# Patient Record
Sex: Female | Born: 1995 | Hispanic: Yes | Marital: Single | Smoking: Never smoker
Health system: Southern US, Community
[De-identification: ages and names within clinical notes are randomized; demographics above are authoritative.]

## PROBLEM LIST (undated history)

## (undated) DIAGNOSIS — I10 Essential (primary) hypertension: Secondary | ICD-10-CM

## (undated) DIAGNOSIS — D649 Anemia, unspecified: Secondary | ICD-10-CM

## (undated) DIAGNOSIS — Z789 Other specified health status: Secondary | ICD-10-CM

## (undated) HISTORY — PX: NO PAST SURGERIES: SHX2092

## (undated) HISTORY — DX: Anemia, unspecified: D64.9

## (undated) HISTORY — DX: Essential (primary) hypertension: I10

---
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
## 2014-05-27 ENCOUNTER — Encounter: Payer: Self-pay | Admitting: Advanced Practice Midwife

## 2015-08-18 ENCOUNTER — Encounter (HOSPITAL_COMMUNITY): Payer: Self-pay | Admitting: Emergency Medicine

## 2015-08-18 DIAGNOSIS — R509 Fever, unspecified: Secondary | ICD-10-CM | POA: Diagnosis not present

## 2015-08-18 DIAGNOSIS — Z79899 Other long term (current) drug therapy: Secondary | ICD-10-CM | POA: Insufficient documentation

## 2015-08-18 DIAGNOSIS — R42 Dizziness and giddiness: Secondary | ICD-10-CM | POA: Insufficient documentation

## 2015-08-18 DIAGNOSIS — D649 Anemia, unspecified: Secondary | ICD-10-CM | POA: Diagnosis not present

## 2015-08-18 DIAGNOSIS — R093 Abnormal sputum: Secondary | ICD-10-CM | POA: Insufficient documentation

## 2015-08-18 DIAGNOSIS — R110 Nausea: Secondary | ICD-10-CM | POA: Diagnosis not present

## 2015-08-18 DIAGNOSIS — Z3202 Encounter for pregnancy test, result negative: Secondary | ICD-10-CM | POA: Insufficient documentation

## 2015-08-18 DIAGNOSIS — Z793 Long term (current) use of hormonal contraceptives: Secondary | ICD-10-CM | POA: Insufficient documentation

## 2015-08-18 LAB — COMPREHENSIVE METABOLIC PANEL
ALBUMIN: 3.8 g/dL (ref 3.5–5.0)
ALK PHOS: 40 U/L (ref 38–126)
ALT: 10 U/L — ABNORMAL LOW (ref 14–54)
AST: 22 U/L (ref 15–41)
Anion gap: 10 (ref 5–15)
BILIRUBIN TOTAL: 0.5 mg/dL (ref 0.3–1.2)
BUN: 9 mg/dL (ref 6–20)
CO2: 29 mmol/L (ref 22–32)
Calcium: 9.2 mg/dL (ref 8.9–10.3)
Chloride: 100 mmol/L — ABNORMAL LOW (ref 101–111)
Creatinine, Ser: 0.59 mg/dL (ref 0.44–1.00)
GFR calc Af Amer: >60 mL/min (ref >60)
GFR calc non Af Amer: >60 mL/min (ref >60)
GLUCOSE: 90 mg/dL (ref 65–99)
POTASSIUM: 3.3 mmol/L — AB (ref 3.5–5.1)
Sodium: 139 mmol/L (ref 135–145)
TOTAL PROTEIN: 7.1 g/dL (ref 6.5–8.1)

## 2015-08-18 LAB — CBC
HEMATOCRIT: 36.4 % (ref 36.0–46.0)
Hemoglobin: 12.7 g/dL (ref 12.0–15.0)
MCH: 29.7 pg (ref 26.0–34.0)
MCHC: 34.9 g/dL (ref 30.0–36.0)
MCV: 85.2 fL (ref 78.0–100.0)
Platelets: 134 10*3/uL — ABNORMAL LOW (ref 150–400)
RBC: 4.27 MIL/uL (ref 3.87–5.11)
RDW: 12.8 % (ref 11.5–15.5)
WBC: 5.2 10*3/uL (ref 4.0–10.5)

## 2015-08-18 LAB — URINALYSIS, ROUTINE W REFLEX MICROSCOPIC
BILIRUBIN URINE: NEGATIVE
GLUCOSE, UA: NEGATIVE mg/dL
HGB URINE DIPSTICK: NEGATIVE
KETONES UR: NEGATIVE mg/dL
Leukocytes, UA: NEGATIVE
NITRITE: NEGATIVE
PH: 6.5 (ref 5.0–8.0)
Protein, ur: NEGATIVE mg/dL
Specific Gravity, Urine: 1.025 (ref 1.005–1.030)

## 2015-08-18 LAB — I-STAT BETA HCG BLOOD, ED (MC, WL, AP ONLY): I-stat hCG, quantitative: <5 m[IU]/mL (ref <5)

## 2015-08-18 LAB — LIPASE, BLOOD: Lipase: 30 U/L (ref 11–51)

## 2015-08-18 NOTE — ED Notes (Signed)
Pt. presents with multiple complaints : fever with emesis , dizziness , nausea , headache and fatigue onset 2 days ago .

## 2015-08-19 ENCOUNTER — Emergency Department (HOSPITAL_COMMUNITY)
Admission: EM | Admit: 2015-08-19 | Discharge: 2015-08-19 | Disposition: A | Discharge status: Home / Self Care | Payer: Medicaid Other | Attending: Emergency Medicine | Admitting: Emergency Medicine

## 2015-08-19 DIAGNOSIS — R110 Nausea: Secondary | ICD-10-CM

## 2015-08-19 MED ORDER — ONDANSETRON 4 MG PO TBDP: 4.0000 mg | ORAL_TABLET | Freq: Three times a day (TID) | ORAL | Status: DC | PRN

## 2015-08-19 MED ORDER — ONDANSETRON 4 MG PO TBDP
4.0000 mg | ORAL_TABLET | Freq: Once | ORAL | Status: AC
  Administered 2015-08-19: 4 mg via ORAL
  Filled 2015-08-19: qty 1

## 2015-08-19 MED ORDER — POTASSIUM CHLORIDE CRYS ER 20 MEQ PO TBCR
40.0000 meq | EXTENDED_RELEASE_TABLET | Freq: Once | ORAL | Status: AC
  Administered 2015-08-19: 40 meq via ORAL
  Filled 2015-08-19: qty 2

## 2015-08-19 NOTE — Discharge Instructions (Signed)
Nausea, Adult °Nausea is the feeling that you have an upset stomach or have to vomit. Nausea by itself is not likely a serious concern, but it may be an early sign of more serious medical problems. As nausea gets worse, it can lead to vomiting. If vomiting develops, there is the risk of dehydration.  °CAUSES  °· Viral infections. °· Food poisoning. °· Medicines. °· Pregnancy. °· Motion sickness. °· Migraine headaches. °· Emotional distress. °· Severe pain from any source. °· Alcohol intoxication. °HOME CARE INSTRUCTIONS °· Get plenty of rest. °· Ask your caregiver about specific rehydration instructions. °· Eat small amounts of food and sip liquids more often. °· Take all medicines as told by your caregiver. °SEEK MEDICAL CARE IF: °· You have not improved after 2 days, or you get worse. °· You have a headache. °SEEK IMMEDIATE MEDICAL CARE IF:  °· You have a fever. °· You faint. °· You keep vomiting or have blood in your vomit. °· You are extremely weak or dehydrated. °· You have dark or bloody stools. °· You have severe chest or abdominal pain. °MAKE SURE YOU: °· Understand these instructions. °· Will watch your condition. °· Will get help right away if you are not doing well or get worse. °  °This information is not intended to replace advice given to you by your health care provider. Make sure you discuss any questions you have with your health care provider. °  °Document Released: 08/19/2004 Document Revised: 08/02/2014 Document Reviewed: 03/24/2011 °Elsevier Interactive Patient Education ©2016 [employer] ° °

## 2015-08-19 NOTE — ED Provider Notes (Signed)
By signing my name below, I, Bethel Born, attest that this documentation has been prepared under the direction and in the presence of  N , DO. Electronically Signed: Bethel Born, ED Scribe. 08/19/2015. 2:08 AM.  TIME SEEN: 2:08 AM  CHIEF COMPLAINT: Nausea  HPI: Brooke Patel is a 20 y.o. female who presents to the Emergency Department complaining of nausea with onset yesterday. Pt reports more than 10 episodes of "spitting".  Associated symptoms include lightheadedness. She had a fever 2 days ago but has none at present. Denies abdominal pain. Also complains of white discharge with urination. She does not see discharge in her underwear.  Pt denies diarrhea, cough, dysuria, hematuria, and vaginal discharge. Her significant other has been ill but not with similar symptoms. LNMP was 08/06/15. No history of abdominal surgery. No recent travel. She denies any actual vomiting.  ROS: See HPI Constitutional: no fever  Eyes: no drainage  ENT: no runny nose   Cardiovascular:  no chest pain  Resp: no SOB  GI: no vomiting GU: no dysuria Integumentary: no rash  Allergy: no hives  Musculoskeletal: no leg swelling  Neurological: no slurred speech ROS otherwise negative  PAST MEDICAL HISTORY/PAST SURGICAL HISTORY:  Past Medical History  Diagnosis Date  . No pertinent past medical history   . Anemia   . Anemia     MEDICATIONS:  Prior to Admission medications   Medication Sig Start Date End Date Taking? Authorizing Provider  ferrous sulfate (FERROUSUL) 325 (65 FE) MG tablet Take 1 tablet (325 mg total) by mouth daily with breakfast. 04/03/11 04/02/12  Lucina Mellow, DO  norgestimate-ethinyl estradiol (SPRINTEC 28) 0.25-35 MG-MCG tablet One tablet daily for birth control. Do not start before 4 week postpartum. 04/03/11   Lucina Mellow, DO    ALLERGIES:  No Known Allergies  SOCIAL HISTORY:  Social History  Substance Use Topics  . Smoking status: Never Smoker   .  Smokeless tobacco: Never Used  . Alcohol Use: No    FAMILY HISTORY: Family History  Problem Relation Age of Onset  . Diabetes Paternal Grandmother   . Cancer Paternal Grandmother     EXAM: BP 125/63 mmHg  Pulse 72  Temp(Src) 98 F (36.7 C) (Oral)  Resp 14  Ht  (1.6 m)  Wt 115 lb (52.164 kg)  BMI 20.38 kg/m2  SpO2 100%  LMP 08/06/2015 CONSTITUTIONAL: Alert and oriented and responds appropriately to questions. Well-appearing; well-nourished, afebrile, nontoxic HEAD: Normocephalic EYES: Conjunctivae clear, PERRL ENT: normal nose; no rhinorrhea; moist mucous membranes; pharynx without lesions noted NECK: Supple, no meningismus, no LAD  CARD: RRR; S1 and S2 appreciated; no murmurs, no clicks, no rubs, no gallops RESP: Normal chest excursion without splinting or tachypnea; breath sounds clear and equal bilaterally; no wheezes, no rhonchi, no rales, no hypoxia or respiratory distress, speaking full sentences ABD/GI: Normal bowel sounds; non-distended; soft, non-tender, no rebound, no guarding, no peritoneal signs BACK:  The back appears normal and is non-tender to palpation, there is no CVA tenderness EXT: Normal ROM in all joints; non-tender to palpation; no edema; normal capillary refill; no cyanosis, no calf tenderness or swelling    SKIN: Normal color for age and race; warm NEURO: Moves all extremities equally, sensation to light touch intact diffusely, cranial nerves II through XII intact PSYCH: The patient's mood and manner are appropriate. Grooming and personal hygiene are appropriate.  MEDICAL DECISION MAKING: Patient here with nausea. Had fever 2 days ago. States that she never acts and vomited but  would "spit". No diarrhea. Abdominal exam is completely benign. Hemodynamically stable. Afebrile and nontoxic. Labs unremarkable other than potassium level of 3.3 which has been replaced. She is not pregnant. Does report white discharge with urination but no dysuria or hematuria.  She is not sure if this discharges coming from urinating or her vagina. Have offered her pelvic exam but she states she will follow-up with her OB/GYN. No lower abdominal pain on exam. She is sectioned active with her husband. No history of STDs. Patient reports feeling better after Zofran. We'll discharge with prescription for Zofran and a coupon for the same. Doubt appendicitis, diverticulitis, colitis, bowel obstruction or other life-threatening illness given her benign exam. Discussed return precautions. Patient verbalizes understanding and is comfortable with this plan. At this time I do not feel she needs emergent abdominal imaging.   I personally performed the services described in this documentation, which was scribed in my presence. The recorded information has been reviewed and is accurate.     Layla Maw , DO 08/19/15 0300

## 2016-10-24 ENCOUNTER — Emergency Department (HOSPITAL_COMMUNITY)
Admission: EM | Admit: 2016-10-24 | Discharge: 2016-10-24 | Disposition: A | Discharge status: Home / Self Care | Payer: Medicaid Other | Attending: Emergency Medicine | Admitting: Emergency Medicine

## 2016-10-24 ENCOUNTER — Encounter (HOSPITAL_COMMUNITY): Payer: Self-pay | Admitting: Emergency Medicine

## 2016-10-24 DIAGNOSIS — R112 Nausea with vomiting, unspecified: Secondary | ICD-10-CM | POA: Diagnosis not present

## 2016-10-24 DIAGNOSIS — R110 Nausea: Secondary | ICD-10-CM

## 2016-10-24 DIAGNOSIS — R197 Diarrhea, unspecified: Secondary | ICD-10-CM

## 2016-10-24 LAB — POC URINE PREG, ED: PREG TEST UR: NEGATIVE

## 2016-10-24 LAB — CBC
HCT: 41.2 % (ref 36.0–46.0)
HEMOGLOBIN: 14.1 g/dL (ref 12.0–15.0)
MCH: 28.5 pg (ref 26.0–34.0)
MCHC: 34.2 g/dL (ref 30.0–36.0)
MCV: 83.4 fL (ref 78.0–100.0)
Platelets: 196 10*3/uL (ref 150–400)
RBC: 4.94 MIL/uL (ref 3.87–5.11)
RDW: 12.9 % (ref 11.5–15.5)
WBC: 7.9 10*3/uL (ref 4.0–10.5)

## 2016-10-24 LAB — LIPASE, BLOOD: Lipase: 14 U/L (ref 11–51)

## 2016-10-24 LAB — URINALYSIS, ROUTINE W REFLEX MICROSCOPIC
Bilirubin Urine: NEGATIVE
Glucose, UA: NEGATIVE mg/dL
Ketones, ur: 20 mg/dL — AB
Leukocytes, UA: NEGATIVE
Nitrite: NEGATIVE
Protein, ur: NEGATIVE mg/dL
SPECIFIC GRAVITY, URINE: 1.005 (ref 1.005–1.030)
pH: 6 (ref 5.0–8.0)

## 2016-10-24 LAB — COMPREHENSIVE METABOLIC PANEL
ALBUMIN: 4.8 g/dL (ref 3.5–5.0)
ALK PHOS: 48 U/L (ref 38–126)
ALT: 9 U/L — AB (ref 14–54)
ANION GAP: 12 (ref 5–15)
AST: 18 U/L (ref 15–41)
BUN: 10 mg/dL (ref 6–20)
CALCIUM: 9.7 mg/dL (ref 8.9–10.3)
CO2: 22 mmol/L (ref 22–32)
CREATININE: 0.75 mg/dL (ref 0.44–1.00)
Chloride: 104 mmol/L (ref 101–111)
GFR calc Af Amer: >60 mL/min (ref >60)
GFR calc non Af Amer: >60 mL/min (ref >60)
GLUCOSE: 89 mg/dL (ref 65–99)
Potassium: 3.5 mmol/L (ref 3.5–5.1)
SODIUM: 138 mmol/L (ref 135–145)
Total Bilirubin: 0.8 mg/dL (ref 0.3–1.2)
Total Protein: 8.2 g/dL — ABNORMAL HIGH (ref 6.5–8.1)

## 2016-10-24 MED ORDER — ONDANSETRON 4 MG PO TBDP
4.0000 mg | ORAL_TABLET | Freq: Once | ORAL | Status: AC
  Administered 2016-10-24: 4 mg via ORAL
  Filled 2016-10-24: qty 1

## 2016-10-24 MED ORDER — ONDANSETRON HCL 4 MG PO TABS: 4.0000 mg | ORAL_TABLET | Freq: Four times a day (QID) | ORAL | 0 refills | Status: DC | PRN

## 2016-10-24 NOTE — ED Notes (Signed)
Pt given sprite to drink. Will reassess.

## 2016-10-24 NOTE — ED Triage Notes (Signed)
n/v/d x 1 week lmp  March 6

## 2016-10-24 NOTE — Discharge Instructions (Signed)
Your blood work is reassuring today.  Please drink plenty of fluids. You may be feeling this way given that you are about to start your period.  Please return without fail for worsening symptoms, including fever, passing out, severe abdominal pain, or any other symptoms concerning to you.

## 2016-10-24 NOTE — ED Notes (Signed)
ED Provider at bedside. 

## 2016-10-24 NOTE — ED Provider Notes (Signed)
[facility]-EMERGENCY DEPT Provider Note   CSN: [id number] Arrival date & time: 10/24/16  1500  By signing my name below, I, Brooke Patel, attest that this documentation has been prepared under the direction and in the presence of physician practitioner, Lavera Guise, MD. Electronically Signed: [name], Scribe. 10/24/2016. 4:34 PM.  History   Chief Complaint Chief Complaint  Patient presents with  . Emesis    The history is provided by the patient. No language interpreter was used.     HPI Comments: Brooke Patel is a 21 y.o. female who presents to the Emergency Department complaining of intermittent nausea for about one week. She reports an associated reduced appetite and lightheadedness due to decreased PO intake. Pt states she started having diarrhea yesterday and has had 3 episodes. She states she has been consistently nauseous with or without PO intake. Pt reports she started having some suprapubic cramping today and notes her menstrual period is scheduled to begin in the next few days. Has had little vaginal spotting. She has been trying to hydrate adequately. No alleviating factors noted. Pt denies vomiting, hematochezia, fevers, cough, congestion, rhinorrhea, dysuria, urinary frequency, abnormal vaginal bleeding/discharge, syncope, or any other associated symptoms.  Past Medical History:  Diagnosis Date  . Anemia   . Anemia   . No pertinent past medical history     Patient Active Problem List   Diagnosis Date Noted  . Teen pregnancy 03/03/2011  . Supervision of normal pregnancy 02/17/2011    Past Surgical History:  Procedure Laterality Date  . NO PAST SURGERIES      OB History    Gravida Para Term Preterm AB Living   SAB TAB Ectopic Multiple Live Births           1       Home Medications    Prior to Admission medications   Medication Sig Start Date End Date Taking? Authorizing Provider  ferrous sulfate (FERROUSUL) 325 (65 FE) MG tablet Take 1  tablet (325 mg total) by mouth daily with breakfast. 04/03/11 04/02/12  Lucina Mellow, DO  norgestimate-ethinyl estradiol (SPRINTEC 28) 0.25-35 MG-MCG tablet One tablet daily for birth control. Do not start before 4 week postpartum. 04/03/11   Suzanna Holbrook, DO  ondansetron (ZOFRAN ODT) 4 MG disintegrating tablet Take 1 tablet (4 mg total) by mouth every 8 (eight) hours as needed for nausea or vomiting. 08/19/15   Kristen N Ward, DO  ondansetron (ZOFRAN) 4 MG tablet Take 1 tablet (4 mg total) by mouth every 6 (six) hours as needed for nausea or vomiting. 10/24/16   Lavera Guise, MD    Family History Family History  Problem Relation Age of Onset  . Diabetes Paternal Grandmother   . Cancer Paternal Grandmother     Social History Social History  Substance Use Topics  . Smoking status: Never Smoker  . Smokeless tobacco: Never Used  . Alcohol use No     Allergies   Patient has no known allergies.   Review of Systems Review of Systems  10/14 systems reviewed and are negative other than those stated in the HPI  Physical Exam Updated Vital Signs BP 139/84 (BP Location: Left Arm)   Pulse 97   Temp 98.1 F (36.7 C) (Oral)   Resp 10   SpO2 100%   Physical Exam Physical Exam  Nursing note and vitals reviewed. Constitutional: Well developed, well nourished, non-toxic, and in no acute distress Head:  Normocephalic and atraumatic.  Mouth/Throat: Oropharynx is clear and moist.  Neck: Normal range of motion. Neck supple.  Cardiovascular: Normal rate and regular rhythm.   Pulmonary/Chest: Effort normal and breath sounds normal.  Abdominal: Soft. There is no tenderness. There is no rebound and no guarding.  Musculoskeletal: Normal range of motion.  Neurological: Alert, no facial droop, fluent speech, moves all extremities symmetrically Skin: Skin is warm and dry.  Psychiatric: Cooperative  ED Treatments / Results  Labs (all labs ordered are listed, but only abnormal results are  displayed) Labs Reviewed  COMPREHENSIVE METABOLIC PANEL - Abnormal; Notable for the following:       Result Value   Total Protein 8.2 (*)    ALT 9 (*)    All other components within normal limits  URINALYSIS, ROUTINE W REFLEX MICROSCOPIC - Abnormal; Notable for the following:    Hgb urine dipstick SMALL (*)    Ketones, ur 20 (*)    Bacteria, UA RARE (*)    Squamous Epithelial / LPF 0-5 (*)    All other components within normal limits  LIPASE, BLOOD  CBC  POC URINE PREG, ED    EKG  EKG Interpretation None       Radiology No results found.  Procedures Procedures (including critical care time)  DIAGNOSTIC STUDIES: Oxygen Saturation is 100% on RA, normal by my interpretation.    COORDINATION OF CARE: 4:40 PM Discussed treatment plan with pt at bedside and pt agreed to plan.  Medications Ordered in ED Medications  ondansetron (ZOFRAN-ODT) disintegrating tablet 4 mg (4 mg Oral Given 10/24/16 1645)     Initial Impression / Assessment and Plan / ED Course  I have reviewed the triage vital signs and the nursing notes.  Pertinent labs & imaging results that were available during my care of the patient were reviewed by me and considered in my medical decision making (see chart for details).     Presents with nausea for one week with diarrhea today. Is well appearing and in no acute distress. She appears well hydrated with stable vital signs. Abdomen soft benign. Remainder of exam non-focal. Blood work reasuring. No metabolic or electrolyte derangements. No anemia. No evidence of UTI and she is not pregnant. Suspect potentially that may be feeling poorly just prior to start of her menses, which she states she has felt so before. Given zofran odt and feels improved. Drinking fluids in ED w/o difficulty. Plan to discharge with antiemetics and supportive care. Strict return and follow-up instructions reviewed. She expressed understanding of all discharge instructions and felt  comfortable with the plan of care.   Final Clinical Impressions(s) / ED Diagnoses   Final diagnoses:  Nausea  Diarrhea, unspecified type    New Prescriptions New Prescriptions   ONDANSETRON (ZOFRAN) 4 MG TABLET    Take 1 tablet (4 mg total) by mouth every 6 (six) hours as needed for nausea or vomiting.   I personally performed the services described in this documentation, which was scribed in my presence. The recorded information has been reviewed and is accurate.    Lavera Guise, MD 10/24/16 [phone]

## 2016-12-13 ENCOUNTER — Ambulatory Visit (INDEPENDENT_AMBULATORY_CARE_PROVIDER_SITE_OTHER): Payer: Medicaid Other | Admitting: Physician Assistant

## 2016-12-13 ENCOUNTER — Encounter (INDEPENDENT_AMBULATORY_CARE_PROVIDER_SITE_OTHER): Payer: Self-pay | Admitting: Physician Assistant

## 2016-12-13 VITALS — BP 112/68 | HR 83 | Temp 97.8°F | Ht 63.0 in | Wt 120.8 lb

## 2016-12-13 DIAGNOSIS — N3000 Acute cystitis without hematuria: Secondary | ICD-10-CM | POA: Diagnosis not present

## 2016-12-13 DIAGNOSIS — F39 Unspecified mood [affective] disorder: Secondary | ICD-10-CM | POA: Diagnosis not present

## 2016-12-13 DIAGNOSIS — R630 Anorexia: Secondary | ICD-10-CM | POA: Diagnosis not present

## 2016-12-13 DIAGNOSIS — R4586 Emotional lability: Secondary | ICD-10-CM

## 2016-12-13 LAB — POCT URINALYSIS DIPSTICK
BILIRUBIN UA: NEGATIVE
GLUCOSE UA: NEGATIVE
Ketones, UA: NEGATIVE
Nitrite, UA: NEGATIVE
PH UA: 5.5 (ref 5.0–8.0)
Protein, UA: NEGATIVE
RBC UA: NEGATIVE
SPEC GRAV UA: 1.015 (ref 1.010–1.025)
Urobilinogen, UA: 0.2 E.U./dL

## 2016-12-13 LAB — POCT URINE PREGNANCY: Preg Test, Ur: NEGATIVE

## 2016-12-13 LAB — POCT CBG (FASTING - GLUCOSE)-MANUAL ENTRY: Glucose Fasting, POC: 97 mg/dL (ref 70–99)

## 2016-12-13 LAB — POCT GLYCOSYLATED HEMOGLOBIN (HGB A1C): Hemoglobin A1C: 5.1

## 2016-12-13 MED ORDER — CIPROFLOXACIN HCL 500 MG PO TABS: 500.0000 mg | ORAL_TABLET | Freq: Two times a day (BID) | ORAL | 0 refills | Status: AC

## 2016-12-13 MED ORDER — NAPROXEN 500 MG PO TABS: 500.0000 mg | ORAL_TABLET | Freq: Two times a day (BID) | ORAL | 0 refills | Status: DC

## 2016-12-13 NOTE — Progress Notes (Signed)
Subjective:  Patient ID: Brooke NashJennifer Patel, female    DOB: May 09, 1996  Age: 21 y.o. MRN: [id number]  CC: fatigue  HPI Brooke NashJennifer Olds is a 21 y.o. female with a PMH of anemia presents with concern of decreased appetite. Onset of decreased appetite at the end of March. Went to ED on 10/24/16. Associated nausea and vomiting. Nausea has resolved with ondansetron and is now two weeks off ondansetron. Only complaints are of LBP and suprapubic pain since her ED visit. She is also concerned she may have diabetes since most of her family is diabetic.     Pt endorses having anxiety issues for as long as she can remember. Has at times felt that she is shaking because of her anxiety. She is currently a mother of two young children which may contribute to her stress. Has never seen a psychiatrist or psychologist. Has never taken antidepressives or anxiolytics. Denies depression, Suicidal/Homicidal intent/ideation.     Outpatient Medications Prior to Visit  Medication Sig Dispense Refill  . ferrous sulfate (FERROUSUL) 325 (65 FE) MG tablet Take 1 tablet (325 mg total) by mouth daily with breakfast. 30 tablet 11  . norgestimate-ethinyl estradiol (SPRINTEC 28) 0.25-35 MG-MCG tablet One tablet daily for birth control. Do not start before 4 week postpartum. (Patient not taking: Reported on 12/13/2016) 1 Package 3  . ondansetron (ZOFRAN ODT) 4 MG disintegrating tablet Take 1 tablet (4 mg total) by mouth every 8 (eight) hours as needed for nausea or vomiting. (Patient not taking: Reported on 12/13/2016) 20 tablet 0  . ondansetron (ZOFRAN) 4 MG tablet Take 1 tablet (4 mg total) by mouth every 6 (six) hours as needed for nausea or vomiting. (Patient not taking: Reported on 12/13/2016) 12 tablet 0   No facility-administered medications prior to visit.      ROS Review of Systems  Constitutional: Negative for chills, fever and malaise/fatigue.  Eyes: Negative for blurred vision.  Respiratory: Negative for shortness of  breath.   Cardiovascular: Negative for chest pain and palpitations.  Gastrointestinal: Negative for abdominal pain and nausea.       Suprapubic pain  Genitourinary: Negative for dysuria and hematuria.  Musculoskeletal: Negative for joint pain and myalgias.  Skin: Negative for rash.  Neurological: Negative for tingling and headaches.  Endo/Heme/Allergies:       Poor appetite  Psychiatric/Behavioral: Negative for depression. The patient is not nervous/anxious.     Objective:  BP 112/68 (BP Location: Right Arm, Patient Position: Sitting, Cuff Size: Normal)   Pulse 83   Temp 97.8 F (36.6 C) (Oral)   Ht 5\' 3"  (1.6 m)   Wt 120 lb 12.8 oz (54.8 kg)   LMP 11/24/2016 (Exact Date)   SpO2 98%   BMI 21.40 kg/m   BP/Weight 12/13/2016 10/24/2016 08/19/2015  Systolic BP 112 123 109  Diastolic BP 68 73 72  Wt. (Lbs) 120.8 - -  BMI 21.4 - 20.38      Physical Exam  Constitutional: She is oriented to person, place, and time.  Well developed, well nourished, NAD, polite  HENT:  Head: Normocephalic and atraumatic.  Eyes: No scleral icterus.  Neck: Normal range of motion. Neck supple. No thyromegaly present.  Cardiovascular: Normal rate, regular rhythm and normal heart sounds.   Pulmonary/Chest: Effort normal and breath sounds normal.  Abdominal: Soft. Bowel sounds are normal. There is no tenderness.  Musculoskeletal: She exhibits no edema.  Neurological: She is alert and oriented to person, place, and time. No cranial nerve deficit. Coordination normal.  Skin: Skin is warm and dry. No rash noted. No erythema. No pallor.  Periorbital hyperpigmentation bilaterally  Psychiatric: She has a normal mood and affect. Her behavior is normal. Thought content normal.  Vitals reviewed.    Assessment & Plan:   1. Anorexia - Urinalysis Dipstick with small leukocytes in clinic today - POCT urine pregnancy negative in clinic today - Glucose (CBG), Fasting 97 in clinic today - HgB A1c 5.1% in  clinic today. - TSH - Comprehensive metabolic panel - CBC with Differential  2. Acute cystitis without hematuria - Begin ciprofloxacin (CIPRO) 500 MG tablet; Take 1 tablet (500 mg total) by mouth 2 (two) times daily.  Dispense: 6 tablet; Refill: 0 - Begin naproxen (NAPROSYN) 500 MG tablet; Take 1 tablet (500 mg total) by mouth 2 (two) times daily with a meal.  Dispense: 30 tablet; Refill: 0 - Urine culture  3. Mood Disturbance - Will await lab results. May likely need psychological referral if not attributed to an organic etiology.       Meds ordered this encounter  Medications  . ciprofloxacin (CIPRO) 500 MG tablet    Sig: Take 1 tablet (500 mg total) by mouth 2 (two) times daily.    Dispense:  6 tablet    Refill:  0    Order Specific Question:   Supervising Provider    Answer:   Quentin Angst [id number]  . naproxen (NAPROSYN) 500 MG tablet    Sig: Take 1 tablet (500 mg total) by mouth 2 (two) times daily with a meal.    Dispense:  30 tablet    Refill:  0    Order Specific Question:   Supervising Provider    Answer:   Quentin Angst [[id number]]    Follow-up: Return in about 2 weeks (around 12/27/2016) for f/u anorexia and UTI.   Loletta Specter PA

## 2016-12-13 NOTE — Patient Instructions (Signed)

## 2016-12-14 LAB — COMPREHENSIVE METABOLIC PANEL
ALT: 7 IU/L (ref 0–32)
AST: 14 IU/L (ref 0–40)
Albumin/Globulin Ratio: 1.5 (ref 1.2–2.2)
Albumin: 4.2 g/dL (ref 3.5–5.5)
Alkaline Phosphatase: 40 IU/L (ref 39–117)
BUN / CREAT RATIO: 15 (ref 9–23)
BUN: 11 mg/dL (ref 6–20)
Bilirubin Total: 0.3 mg/dL (ref 0.0–1.2)
CALCIUM: 9.1 mg/dL (ref 8.7–10.2)
CO2: 21 mmol/L (ref 18–29)
Chloride: 103 mmol/L (ref 96–106)
Creatinine, Ser: 0.74 mg/dL (ref 0.57–1.00)
GFR calc Af Amer: 132 mL/min/{1.73_m2} (ref >59)
GFR, EST NON AFRICAN AMERICAN: 115 mL/min/{1.73_m2} (ref >59)
GLOBULIN, TOTAL: 2.8 g/dL (ref 1.5–4.5)
GLUCOSE: 93 mg/dL (ref 65–99)
Potassium: 4 mmol/L (ref 3.5–5.2)
SODIUM: 138 mmol/L (ref 134–144)
Total Protein: 7 g/dL (ref 6.0–8.5)

## 2016-12-14 LAB — CBC WITH DIFFERENTIAL/PLATELET
BASOS: 1 %
Basophils Absolute: 0 10*3/uL (ref 0.0–0.2)
EOS (ABSOLUTE): 0.5 10*3/uL — ABNORMAL HIGH (ref 0.0–0.4)
EOS: 8 %
HEMATOCRIT: 39.9 % (ref 34.0–46.6)
HEMOGLOBIN: 13.1 g/dL (ref 11.1–15.9)
Immature Grans (Abs): 0 10*3/uL (ref 0.0–0.1)
Immature Granulocytes: 0 %
LYMPHS ABS: 1.7 10*3/uL (ref 0.7–3.1)
Lymphs: 28 %
MCH: 29.2 pg (ref 26.6–33.0)
MCHC: 32.8 g/dL (ref 31.5–35.7)
MCV: 89 fL (ref 79–97)
MONOCYTES: 7 %
MONOS ABS: 0.4 10*3/uL (ref 0.1–0.9)
NEUTROS ABS: 3.3 10*3/uL (ref 1.4–7.0)
Neutrophils: 56 %
Platelets: 178 10*3/uL (ref 150–379)
RBC: 4.48 x10E6/uL (ref 3.77–5.28)
RDW: 14.3 % (ref 12.3–15.4)
WBC: 5.9 10*3/uL (ref 3.4–10.8)

## 2016-12-14 LAB — TSH: TSH: 4.22 u[IU]/mL (ref 0.450–4.500)

## 2016-12-15 LAB — URINE CULTURE: ORGANISM ID, BACTERIA: NO GROWTH

## 2017-01-06 ENCOUNTER — Ambulatory Visit (INDEPENDENT_AMBULATORY_CARE_PROVIDER_SITE_OTHER): Payer: Medicaid Other | Admitting: Physician Assistant

## 2017-03-01 ENCOUNTER — Ambulatory Visit (INDEPENDENT_AMBULATORY_CARE_PROVIDER_SITE_OTHER): Payer: Medicaid Other | Admitting: Physician Assistant

## 2017-03-01 ENCOUNTER — Encounter (INDEPENDENT_AMBULATORY_CARE_PROVIDER_SITE_OTHER): Payer: Self-pay | Admitting: Physician Assistant

## 2017-03-01 ENCOUNTER — Other Ambulatory Visit (HOSPITAL_COMMUNITY)
Admission: RE | Admit: 2017-03-01 | Discharge: 2017-03-01 | Disposition: A | Discharge status: Home / Self Care | Payer: Medicaid Other | Source: Ambulatory Visit | Attending: Physician Assistant | Admitting: Physician Assistant

## 2017-03-01 VITALS — BP 114/78 | HR 72 | Temp 98.2°F | Ht 62.6 in | Wt 119.6 lb

## 2017-03-01 DIAGNOSIS — R109 Unspecified abdominal pain: Secondary | ICD-10-CM | POA: Diagnosis not present

## 2017-03-01 DIAGNOSIS — R5383 Other fatigue: Secondary | ICD-10-CM

## 2017-03-01 DIAGNOSIS — Z124 Encounter for screening for malignant neoplasm of cervix: Secondary | ICD-10-CM

## 2017-03-01 DIAGNOSIS — Z0000 Encounter for general adult medical examination without abnormal findings: Secondary | ICD-10-CM | POA: Insufficient documentation

## 2017-03-01 DIAGNOSIS — F411 Generalized anxiety disorder: Secondary | ICD-10-CM | POA: Diagnosis not present

## 2017-03-01 LAB — POCT URINALYSIS DIPSTICK
Bilirubin, UA: NEGATIVE
GLUCOSE UA: NEGATIVE
KETONES UA: NEGATIVE
Leukocytes, UA: NEGATIVE
Nitrite, UA: NEGATIVE
Protein, UA: NEGATIVE
RBC UA: NEGATIVE
SPEC GRAV UA: 1.015 (ref 1.010–1.025)
UROBILINOGEN UA: 0.2 U/dL
pH, UA: 6.5 (ref 5.0–8.0)

## 2017-03-01 MED ORDER — NAPROXEN 500 MG PO TABS: 500.0000 mg | ORAL_TABLET | Freq: Two times a day (BID) | ORAL | 0 refills | Status: DC

## 2017-03-01 MED ORDER — ESCITALOPRAM OXALATE 10 MG PO TABS: 10.0000 mg | ORAL_TABLET | Freq: Every day | ORAL | 2 refills | Status: DC

## 2017-03-01 MED ORDER — CLONAZEPAM 0.5 MG PO TABS: 0.5000 mg | ORAL_TABLET | Freq: Every day | ORAL | 0 refills | Status: DC

## 2017-03-01 MED ORDER — VITAMIN D-3 125 MCG (5000 UT) PO TABS: 1.0000 | ORAL_TABLET | Freq: Every day | ORAL | 0 refills | Status: DC

## 2017-03-01 NOTE — Progress Notes (Signed)
Pt complains of dizziness, weakness and tiredness

## 2017-03-01 NOTE — Progress Notes (Signed)
Subjective:  Patient ID: Brooke Patel, female    DOB: 10-Apr-1996  Age: 21 y.o. MRN: [id number]  CC: PAP  HPI Brooke Patel is a 21 y.o. female with a PMH of anemia presents for an annual physical. Complains of fatigue, left flank pain x3 days, vaginal discharge, and feeling anxious. She is a mother of two young girls and constantly feels stressed. Has difficulty sleeping at night due to worries. Does not endorse CP, palpitations, SOB, HA, other abdominal pain, f/c/n/v, or GI/GU sxs.    Outpatient Medications Prior to Visit  Medication Sig Dispense Refill  . ferrous sulfate (FERROUSUL) 325 (65 FE) MG tablet Take 1 tablet (325 mg total) by mouth daily with breakfast. 30 tablet 11  . naproxen (NAPROSYN) 500 MG tablet Take 1 tablet (500 mg total) by mouth 2 (two) times daily with a meal. (Patient not taking: Reported on 03/01/2017) 30 tablet 0   No facility-administered medications prior to visit.      ROS Review of Systems  Constitutional: Positive for malaise/fatigue. Negative for chills and fever.  Eyes: Negative for blurred vision.  Respiratory: Negative for shortness of breath.   Cardiovascular: Negative for chest pain and palpitations.  Gastrointestinal: Negative for abdominal pain and nausea.  Genitourinary: Negative for dysuria and hematuria.  Musculoskeletal: Negative for joint pain and myalgias.       Left flank pain  Skin: Negative for rash.  Neurological: Negative for tingling and headaches.  Psychiatric/Behavioral: Negative for depression. The patient is nervous/anxious.     Objective:  BP 114/78 (BP Location: Right Arm, Patient Position: Sitting, Cuff Size: Normal)   Pulse 72   Temp 98.2 F (36.8 C) (Oral)   Ht 5' 2.6" (1.59 m)   Wt 119 lb 9.6 oz (54.3 kg)   LMP 02/23/2017 (Exact Date)   SpO2 97%   BMI 21.46 kg/m   BP/Weight 03/01/2017 12/13/2016 10/24/2016  Systolic BP 114 112 123  Diastolic BP 78 68 73  Wt. (Lbs) 119.6 120.8 -  BMI 21.46 21.4 -       Physical Exam  Constitutional: She is oriented to person, place, and time.  Well developed, well nourished, NAD, polite  HENT:  Head: Normocephalic and atraumatic.  Eyes: No scleral icterus.  Neck: Normal range of motion. Neck supple. No thyromegaly present.  Cardiovascular: Normal rate, regular rhythm and normal heart sounds.   Pulmonary/Chest: Effort normal and breath sounds normal.  Abdominal: Soft. Bowel sounds are normal. There is no tenderness.  Genitourinary:  Genitourinary Comments: Yellowish/greenish vaginal discharge, cervix friable and erythematous. No cervical motion tenderness. No adnexal mass or tenderness bilaterally. No uterine mass or tenderness.  Musculoskeletal: She exhibits no edema.  Neurological: She is alert and oriented to person, place, and time.  Skin: Skin is warm and dry. No rash noted. No erythema. No pallor.  Psychiatric: She has a normal mood and affect. Her behavior is normal. Thought content normal.  Vitals reviewed.    Assessment & Plan:    1. Annual physical exam - Urinalysis Dipstick negative in clinic today  2. Screening for cervical cancer - Cytology - [facility] [place]  3. Fatigue, unspecified type - Cholecalciferol (VITAMIN D-3) 5000 units TABS; Take 1 tablet by mouth daily.  Dispense: 30 tablet; Refill: 0 - CBC with Differential  4. Anxiety state - Begin escitalopram (LEXAPRO) 10 MG tablet; Take 1 tablet (10 mg total) by mouth daily.  Dispense: 30 tablet; Refill: 2 - Begin clonazePAM (KLONOPIN) 0.5 MG tablet; Take 1 tablet (  0.5 mg total) by mouth at bedtime.  Dispense: 30 tablet; Refill: 0  5. Left flank pain - Urinalysis Dipstick negative in clinic today.   Meds ordered this encounter  Medications  . Cholecalciferol (VITAMIN D-3) 5000 units TABS    Sig: Take 1 tablet by mouth daily.    Dispense:  30 tablet    Refill:  0    Order Specific Question:   Supervising Provider    Answer:   Quentin AngstJEGEDE, OLUGBEMIGA E [id number]]  .  escitalopram (LEXAPRO) 10 MG tablet    Sig: Take 1 tablet (10 mg total) by mouth daily.    Dispense:  30 tablet    Refill:  2    Order Specific Question:   Supervising Provider    Answer:   Quentin AngstJEGEDE, OLUGBEMIGA E [id number]]  . clonazePAM (KLONOPIN) 0.5 MG tablet    Sig: Take 1 tablet (0.5 mg total) by mouth at bedtime.    Dispense:  30 tablet    Refill:  0    Order Specific Question:   Supervising Provider    Answer:   Quentin AngstJEGEDE, OLUGBEMIGA E [[id number]]    Follow-up: 4 weeks for anxiety  Loletta Specteroger David  PA

## 2017-03-01 NOTE — Patient Instructions (Signed)
   Trastorno de ansiedad generalizada (Generalized Anxiety Disorder) El trastorno de ansiedad generalizada es un trastorno mental. Interfiere en las funciones vitales, incluyendo las relaciones, el trabajo y la escuela.  Es diferente de la ansiedad normal que todas las personas experimentan en algn momento de su vida en respuesta a sucesos y actividades especficas. En verdad, la ansiedad normal nos ayuda a prepararnos y atravesar estos acontecimientos y actividades de la vida. La ansiedad normal desaparece despus de que el evento o la actividad ha finalizado.  El trastorno de ansiedad generalizada no est necesariamente relacionada con eventos o actividades especficas. Tambin causa un exceso de ansiedad en proporcin a sucesos o actividades especficas. En este trastorno la ansiedad es difcil de controlar. Los sntomas pueden variar de leves a muy graves. Las personas que sufren de trastorno de ansiedad generalizada pueden tener intensas olas de ansiedad con sntomas fsicos (ataques de pnico).  SNTOMAS  La ansiedad y la preocupacin asociada a este trastorno son difciles de controlar. Esta ansiedad y la preocupacin estn relacionados con muchos eventos de la vida y sus actividades y tambin ocurre durante ms das de los que no ocurre, durante 6 meses o ms. Las personas que la sufren pueden tener tres o ms de los siguientes sntomas (uno o ms en los nios):   Agitacin   Fatiga.  Dificultades de concentracin.   Irritabilidad.  Tensin muscular  Dificultad para dormirse o sueo poco satisfactorio. DIAGNSTICO  Se diagnostica a travs de una evaluacin realizada por el mdico. El mdico le har preguntas acerca de su estado de nimo, sntomas fsicos y sucesos de su vida. Le har preguntas sobre su historia clnica, el consumo de alcohol o drogas, incluyendo los medicamentos recetados. Tambin le har un examen fsico e indicar anlisis de sangre. Ciertas enfermedades y el uso de  determinadas sustancias pueden causar sntomas similares a este trastorno. Su mdico lo puede derivar a un especialista en salud mental para una evaluacin ms profunda..  TRATAMIENTO  Las terapias siguientes se utilizan en el tratamiento de este trastorno:   Medicamentos - Se recetan antidepresivos para el control diario a largo plazo. Pueden indicarse tambin medicamentos para combatir la ansiedad en los casos graves, especialmente cuando ocurren ataques de pnico.   Terapia conversada (psicoterapia) Ciertos tipos de psicoterapia pueden ser tiles en el tratamiento del trastorno de ansiedad generalizada, proporcionando apoyo, educacin y orientacin. Una forma de psicoterapia llamada terapia cognitivo-conductual puede ensearle formas saludables de pensar y reaccionar a los eventos y actividades de la vida diaria.  Tcnicasde manejo del estrs- Estas tcnicas incluyen el yoga, la meditacin y el ejercicio y pueden ser muy tiles cuando se practican con regularidad. Un especialista en salud mental puede ayudar a determinar qu tratamiento es mejor para usted. Algunas personas obtienen mejora con una terapia. Sin embargo, otras personas requieren una combinacin de terapias.  Esta informacin no tiene como fin reemplazar el consejo del mdico. Asegrese de hacerle al mdico cualquier pregunta que tenga. Document Released: 11/06/2012 Document Revised: 08/02/2014 Elsevier Interactive Patient Education  2017 [employer] Inc[employer]  

## 2017-03-02 LAB — CBC WITH DIFFERENTIAL/PLATELET
BASOS ABS: 0 10*3/uL (ref 0.0–0.2)
Basos: 0 %
EOS (ABSOLUTE): 0.4 10*3/uL (ref 0.0–0.4)
Eos: 6 %
Hematocrit: 39.4 % (ref 34.0–46.6)
Hemoglobin: 13.2 g/dL (ref 11.1–15.9)
Immature Grans (Abs): 0 10*3/uL (ref 0.0–0.1)
Immature Granulocytes: 0 %
LYMPHS ABS: 2.2 10*3/uL (ref 0.7–3.1)
Lymphs: 31 %
MCH: 28.9 pg (ref 26.6–33.0)
MCHC: 33.5 g/dL (ref 31.5–35.7)
MCV: 86 fL (ref 79–97)
MONOS ABS: 0.5 10*3/uL (ref 0.1–0.9)
Monocytes: 7 %
NEUTROS PCT: 56 %
Neutrophils Absolute: 4 10*3/uL (ref 1.4–7.0)
PLATELETS: 217 10*3/uL (ref 150–379)
RBC: 4.57 x10E6/uL (ref 3.77–5.28)
RDW: 13.8 % (ref 12.3–15.4)
WBC: 7.1 10*3/uL (ref 3.4–10.8)

## 2017-03-03 LAB — CYTOLOGY - PAP
Bacterial vaginitis: NEGATIVE
CHLAMYDIA, DNA PROBE: NEGATIVE
Candida vaginitis: NEGATIVE
DIAGNOSIS: NEGATIVE
NEISSERIA GONORRHEA: NEGATIVE
TRICH (WINDOWPATH): NEGATIVE

## 2017-03-29 ENCOUNTER — Ambulatory Visit (INDEPENDENT_AMBULATORY_CARE_PROVIDER_SITE_OTHER): Payer: Medicaid Other | Admitting: Physician Assistant

## 2017-03-29 ENCOUNTER — Encounter (INDEPENDENT_AMBULATORY_CARE_PROVIDER_SITE_OTHER): Payer: Self-pay | Admitting: Physician Assistant

## 2017-03-29 VITALS — BP 111/67 | HR 74 | Temp 98.6°F | Resp 18 | Ht 63.0 in | Wt 120.0 lb

## 2017-03-29 DIAGNOSIS — R5383 Other fatigue: Secondary | ICD-10-CM

## 2017-03-29 DIAGNOSIS — H6121 Impacted cerumen, right ear: Secondary | ICD-10-CM

## 2017-03-29 DIAGNOSIS — R42 Dizziness and giddiness: Secondary | ICD-10-CM

## 2017-03-29 DIAGNOSIS — F411 Generalized anxiety disorder: Secondary | ICD-10-CM

## 2017-03-29 MED ORDER — ESCITALOPRAM OXALATE 10 MG PO TABS: 10.0000 mg | ORAL_TABLET | Freq: Every day | ORAL | 2 refills | Status: DC

## 2017-03-29 MED ORDER — CARBAMIDE PEROXIDE 6.5 % OT SOLN: 5.0000 [drp] | Freq: Two times a day (BID) | OTIC | 0 refills | Status: DC

## 2017-03-29 MED ORDER — VITAMIN D-3 125 MCG (5000 UT) PO TABS: 1.0000 | ORAL_TABLET | Freq: Every day | ORAL | 0 refills | Status: DC

## 2017-03-29 NOTE — Progress Notes (Signed)
Subjective:  Patient ID: Brooke Patel, female    DOB: [birth year]  Age: 21 y.o. MRN: [id number]  CC: No chief complaint on file.   HPI  Brooke Patel is a 21 y.o. female with a PMH of anemia and anxiety presents to f/u on anxiety and left flank pain. Prescribed clonazepam 0.5 mg qhs x30 days zero refills and escitalopram 10 mg qday x30 days 2 refills. Patient did not take any of her medications.     Says she feels lightheaded since the day after her visit here on 03/01/17. Lightheadedness happens when standing from a stooped, supine, or sitting position. Reports not hydrating properly and drinking a lot of soda.    Outpatient Medications Prior to Visit  Medication Sig Dispense Refill  . Cholecalciferol (VITAMIN D-3) 5000 units TABS Take 1 tablet by mouth daily. 30 tablet 0  . clonazePAM (KLONOPIN) 0.5 MG tablet Take 1 tablet (0.5 mg total) by mouth at bedtime. 30 tablet 0  . escitalopram (LEXAPRO) 10 MG tablet Take 1 tablet (10 mg total) by mouth daily. 30 tablet 2  . ferrous sulfate (FERROUSUL) 325 (65 FE) MG tablet Take 1 tablet (325 mg total) by mouth daily with breakfast. 30 tablet 11  . naproxen (NAPROSYN) 500 MG tablet Take 1 tablet (500 mg total) by mouth 2 (two) times daily with a meal. 30 tablet 0   No facility-administered medications prior to visit.      ROS Review of Systems  Constitutional: Positive for malaise/fatigue. Negative for chills and fever.  Eyes: Negative for blurred vision.  Respiratory: Negative for shortness of breath.   Cardiovascular: Negative for chest pain and palpitations.  Gastrointestinal: Negative for abdominal pain and nausea.  Genitourinary: Negative for dysuria and hematuria.  Musculoskeletal: Negative for joint pain and myalgias.  Skin: Negative for rash.  Neurological: Negative for tingling and headaches.  Psychiatric/Behavioral: Negative for depression. The patient is nervous/anxious.     Objective:  Resp 18   Ht 5\' 3"  (1.6 m)   Wt  120 lb (54.4 kg)   BMI 21.26 kg/m   BP/Weight 03/29/2017 03/01/2017 12/13/2016  Systolic BP - 114 112  Diastolic BP - 78 68  Wt. (Lbs) 120 119.6 120.8  BMI 21.26 21.46 21.4      Physical Exam  Constitutional: She is oriented to person, place, and time.  Well developed, well nourished, NAD, polite  HENT:  Head: Normocephalic and atraumatic.  Eyes: Conjunctivae are normal. No scleral icterus.  Neck: Normal range of motion. Neck supple.  Cardiovascular: Normal rate, regular rhythm and normal heart sounds.   Pulmonary/Chest: Effort normal and breath sounds normal.  Musculoskeletal: She exhibits no edema.  Neurological: She is alert and oriented to person, place, and time. No cranial nerve deficit. Coordination normal.  Skin: Skin is warm and dry. No rash noted. No erythema. No pallor.  Psychiatric: She has a normal mood and affect. Her behavior is normal. Thought content normal.  Vitals reviewed.    Assessment & Plan:   1. Anxiety state - Begin escitalopram (LEXAPRO) 10 MG tablet; Take 1 tablet (10 mg total) by mouth daily.  Dispense: 30 tablet; Refill: 2 - Begin Clonazepam 0.5 mg qhs  2. Impacted cerumen of right ear - Begin carbamide peroxide (DEBROX) 6.5 % OTIC solution; Place 5 drops into the right ear 2 (two) times daily.  Dispense: 15 mL; Refill: 0  3. Lightheadedness - Advised to hydrate properly at 8-10 8 oz cups of water daily.  4. Fatigue, unspecified  type - Begin Cholecalciferol (VITAMIN D-3) 5000 units TABS; Take 1 tablet by mouth daily.  Dispense: 30 tablet; Refill: 0   Meds ordered this encounter  Medications  . carbamide peroxide (DEBROX) 6.5 % OTIC solution    Sig: Place 5 drops into the right ear 2 (two) times daily.    Dispense:  15 mL    Refill:  0    Order Specific Question:   Supervising Provider    Answer:   Quentin AngstJEGEDE, OLUGBEMIGA E [id number]]  . escitalopram (LEXAPRO) 10 MG tablet    Sig: Take 1 tablet (10 mg total) by mouth daily.    Dispense:  30  tablet    Refill:  2    Order Specific Question:   Supervising Provider    Answer:   Quentin AngstJEGEDE, OLUGBEMIGA E [id number]]  . Cholecalciferol (VITAMIN D-3) 5000 units TABS    Sig: Take 1 tablet by mouth daily.    Dispense:  30 tablet    Refill:  0    Order Specific Question:   Supervising Provider    Answer:   Quentin AngstJEGEDE, OLUGBEMIGA E [id number]]    Follow-up: Return in about 8 weeks (around 05/24/2017), or if symptoms worsen or fail to improve, for anxiety.Loletta Specter.    David  PA

## 2017-03-29 NOTE — Patient Instructions (Signed)

## 2017-04-26 ENCOUNTER — Emergency Department (HOSPITAL_COMMUNITY): Payer: Medicaid Other

## 2017-04-26 ENCOUNTER — Emergency Department (HOSPITAL_COMMUNITY)
Admission: EM | Admit: 2017-04-26 | Discharge: 2017-04-26 | Disposition: A | Discharge status: Home / Self Care | Payer: Medicaid Other | Attending: Emergency Medicine | Admitting: Emergency Medicine

## 2017-04-26 ENCOUNTER — Encounter (HOSPITAL_COMMUNITY): Payer: Self-pay | Admitting: Nurse Practitioner

## 2017-04-26 DIAGNOSIS — Z79899 Other long term (current) drug therapy: Secondary | ICD-10-CM | POA: Diagnosis not present

## 2017-04-26 DIAGNOSIS — R109 Unspecified abdominal pain: Secondary | ICD-10-CM | POA: Insufficient documentation

## 2017-04-26 DIAGNOSIS — R112 Nausea with vomiting, unspecified: Secondary | ICD-10-CM | POA: Insufficient documentation

## 2017-04-26 LAB — BASIC METABOLIC PANEL
ANION GAP: 8 (ref 5–15)
BUN: 6 mg/dL (ref 6–20)
CHLORIDE: 103 mmol/L (ref 101–111)
CO2: 26 mmol/L (ref 22–32)
Calcium: 9.2 mg/dL (ref 8.9–10.3)
Creatinine, Ser: 0.64 mg/dL (ref 0.44–1.00)
GFR calc non Af Amer: >60 mL/min (ref >60)
Glucose, Bld: 92 mg/dL (ref 65–99)
Potassium: 3.6 mmol/L (ref 3.5–5.1)
Sodium: 137 mmol/L (ref 135–145)

## 2017-04-26 LAB — URINALYSIS, ROUTINE W REFLEX MICROSCOPIC
Bilirubin Urine: NEGATIVE
Glucose, UA: NEGATIVE mg/dL
Ketones, ur: NEGATIVE mg/dL
Leukocytes, UA: NEGATIVE
Nitrite: NEGATIVE
PH: 6 (ref 5.0–8.0)
Protein, ur: NEGATIVE mg/dL
SPECIFIC GRAVITY, URINE: 1.004 — AB (ref 1.005–1.030)

## 2017-04-26 LAB — CBC
HEMATOCRIT: 36.6 % (ref 36.0–46.0)
HEMOGLOBIN: 13 g/dL (ref 12.0–15.0)
MCH: 30.3 pg (ref 26.0–34.0)
MCHC: 35.5 g/dL (ref 30.0–36.0)
MCV: 85.3 fL (ref 78.0–100.0)
Platelets: 163 10*3/uL (ref 150–400)
RBC: 4.29 MIL/uL (ref 3.87–5.11)
RDW: 12.9 % (ref 11.5–15.5)
WBC: 7.8 10*3/uL (ref 4.0–10.5)

## 2017-04-26 LAB — POC URINE PREG, ED: PREG TEST UR: NEGATIVE

## 2017-04-26 MED ORDER — MORPHINE SULFATE (PF) 4 MG/ML IV SOLN
4.0000 mg | Freq: Once | INTRAVENOUS | Status: AC
  Administered 2017-04-26: 4 mg via INTRAVENOUS
  Filled 2017-04-26: qty 1

## 2017-04-26 MED ORDER — SODIUM CHLORIDE 0.9 % IV BOLUS (SEPSIS)
1000.0000 mL | Freq: Once | INTRAVENOUS | Status: AC
  Administered 2017-04-26: 1000 mL via INTRAVENOUS

## 2017-04-26 MED ORDER — ONDANSETRON 8 MG PO TBDP: 8.0000 mg | ORAL_TABLET | Freq: Three times a day (TID) | ORAL | 0 refills | Status: DC | PRN

## 2017-04-26 MED ORDER — IBUPROFEN 600 MG PO TABS: 600.0000 mg | ORAL_TABLET | Freq: Three times a day (TID) | ORAL | 0 refills | Status: DC | PRN

## 2017-04-26 MED ORDER — ONDANSETRON HCL 4 MG/2ML IJ SOLN
4.0000 mg | Freq: Once | INTRAMUSCULAR | Status: AC
  Administered 2017-04-26: 4 mg via INTRAVENOUS
  Filled 2017-04-26: qty 2

## 2017-04-26 NOTE — ED Provider Notes (Signed)
[facility]-EMERGENCY DEPT Provider Note   CSN: [id number] Arrival date & time: 04/26/17  1151     History   Chief Complaint Chief Complaint  Patient presents with  . Flank Pain    left side  . Urinary Frequency    HPI Brooke Patel is a 21 y.o. female.  HPI Patient is a 21 year old female who reports urinary frequency with associated left-sided flank pain.  No fevers or chills.  She does report nausea vomiting.  Denies diarrhea.  Reports her pain is constant.  Pain is moderate in severity.  Never had pain like this before.  No vaginal discharge.  No diarrhea.  No blood in her stool.   Past Medical History:  Diagnosis Date  . Anemia   . Anemia   . No pertinent past medical history     Patient Active Problem List   Diagnosis Date Noted  . Teen pregnancy 03/03/2011  . Supervision of normal pregnancy 02/17/2011    Past Surgical History:  Procedure Laterality Date  . NO PAST SURGERIES      OB History    Gravida Para Term Preterm AB Living   SAB TAB Ectopic Multiple Live Births           1       Home Medications    Prior to Admission medications   Medication Sig Start Date End Date Taking? Authorizing Provider  acetaminophen (TYLENOL) 325 MG tablet Take 325-650 mg by mouth 2 (two) times daily as needed for moderate pain.   Yes [provider]  ciprofloxacin (CIPRO) 500 MG tablet Take 500 mg by mouth 2 (two) times daily.   Yes [provider]  carbamide peroxide (DEBROX) 6.5 % OTIC solution Place 5 drops into the right ear 2 (two) times daily. Patient not taking: Reported on 04/26/2017 03/29/17   Loletta Specter, PA-C  Cholecalciferol (VITAMIN D-3) 5000 units TABS Take 1 tablet by mouth daily. Patient not taking: Reported on 04/26/2017 03/29/17   Loletta Specter, PA-C  clonazePAM (KLONOPIN) 0.5 MG tablet Take 1 tablet (0.5 mg total) by mouth at bedtime. Patient not taking: Reported on 04/26/2017 03/01/17   Loletta Specter, PA-C    escitalopram (LEXAPRO) 10 MG tablet Take 1 tablet (10 mg total) by mouth daily. Patient not taking: Reported on 04/26/2017 03/29/17   Loletta Specter, PA-C  ferrous sulfate (FERROUSUL) 325 (65 FE) MG tablet Take 1 tablet (325 mg total) by mouth daily with breakfast. 04/03/11 04/02/12  Lucina Mellow, DO  ibuprofen (ADVIL,MOTRIN) 600 MG tablet Take 1 tablet (600 mg total) by mouth every 8 (eight) hours as needed. 04/26/17   Azalia Bilis, MD  naproxen (NAPROSYN) 500 MG tablet Take 1 tablet (500 mg total) by mouth 2 (two) times daily with a meal. 03/01/17   Loletta Specter, PA-C  ondansetron (ZOFRAN ODT) 8 MG disintegrating tablet Take 1 tablet (8 mg total) by mouth every 8 (eight) hours as needed for nausea or vomiting. 04/26/17   Azalia Bilis, MD    Family History Family History  Problem Relation Age of Onset  . Diabetes Paternal Grandmother   . Cancer Paternal Grandmother     Social History Social History  Substance Use Topics  . Smoking status: Never Smoker  . Smokeless tobacco: Never Used  . Alcohol use No     Allergies   Patient has no known allergies.   Review of Systems Review of Systems  All other systems reviewed and are negative.    Physical Exam Updated Vital Signs BP 118/79 (BP Location: Right Arm)   Pulse 90   Temp 98.1 F (36.7 C) (Oral)   Resp 12   Ht  (1.6 m)   Wt 54.4 kg (120 lb)   LMP 04/24/2017   SpO2 100%   BMI 21.26 kg/m   Physical Exam  Constitutional: She is oriented to person, place, and time. She appears well-developed and well-nourished.  HENT:  Head: Normocephalic.  Eyes: EOM are normal.  Neck: Normal range of motion.  Cardiovascular: Normal rate.   Pulmonary/Chest: Effort normal and breath sounds normal.  Abdominal: Soft. She exhibits no distension. There is no tenderness.  Musculoskeletal: Normal range of motion.  Neurological: She is alert and oriented to person, place, and time.  Psychiatric: She has a normal mood and  affect.  Nursing note and vitals reviewed.    ED Treatments / Results  Labs (all labs ordered are listed, but only abnormal results are displayed) Labs Reviewed  URINALYSIS, ROUTINE W REFLEX MICROSCOPIC - Abnormal; Notable for the following:       Result Value   Color, Urine STRAW (*)    Specific Gravity, Urine 1.004 (*)    Hgb urine dipstick SMALL (*)    Bacteria, UA RARE (*)    Squamous Epithelial / LPF 0-5 (*)    All other components within normal limits  CBC  BASIC METABOLIC PANEL  POC URINE PREG, ED    EKG  EKG Interpretation None       Radiology Ct Renal Stone Study  Result Date: 04/26/2017 CLINICAL DATA:  One week history of left flank pain. EXAM: CT ABDOMEN AND PELVIS WITHOUT CONTRAST TECHNIQUE: Multidetector CT imaging of the abdomen and pelvis was performed following the standard protocol without IV contrast. COMPARISON:  None. FINDINGS: Lower chest:  Unremarkable. Hepatobiliary: No focal abnormality in the liver on this study without intravenous contrast. There is no evidence for gallstones, gallbladder wall thickening, or pericholecystic fluid. No intrahepatic or extrahepatic biliary dilation. Pancreas: No focal mass lesion. No dilatation of the main duct. No intraparenchymal cyst. No peripancreatic edema. Spleen: No splenomegaly. No focal mass lesion. Adrenals/Urinary Tract: No adrenal nodule or mass. Kidneys are unremarkable. Specifically, no evidence for urinary stone disease. No hydronephrosis. 6 mm hyperattenuating lesion in the interpolar left kidney (image 62 series 3) cannot be fully characterized but is likely a cyst complicated by proteinaceous debris or hemorrhage. No evidence for hydroureter. The urinary bladder appears normal for the degree of distention. Stomach/Bowel: Stomach is nondistended. No gastric wall thickening. No evidence of outlet obstruction. Duodenum is normally positioned as is the ligament of Treitz. No small bowel wall thickening. No small  bowel dilatation. The terminal ileum is normal. The appendix is not visualized, but there is no edema or inflammation in the region of the cecum. No gross colonic mass. No colonic wall thickening. No substantial diverticular change. Vascular/Lymphatic: No abdominal aortic aneurysm. No abdominal aortic atherosclerotic calcification. There is no gastrohepatic or hepatoduodenal ligament lymphadenopathy. No intraperitoneal or retroperitoneal lymphadenopathy. No pelvic sidewall lymphadenopathy. Reproductive: The uterus has normal CT imaging appearance. There is no adnexal mass. Other: No intraperitoneal free fluid. Musculoskeletal: Bone windows reveal no worrisome lytic or sclerotic osseous lesions. IMPRESSION: 1. No definite CT findings to explain the patient's history of left flank pain. 2. 6 mm hyperattenuating focus in the interpolar left kidney cannot be definitively characterized on this study but is likely a cyst  complicated by proteinaceous debris or hemorrhage. Electronically Signed   By: Kennith Center M.D.   On: 04/26/2017 16:35    Procedures Procedures (including critical care time)  Medications Ordered in ED Medications  morphine 4 MG/ML injection 4 mg (4 mg Intravenous Given 04/26/17 1611)  ondansetron (ZOFRAN) injection 4 mg (4 mg Intravenous Given 04/26/17 1611)  sodium chloride 0.9 % bolus 1,000 mL (1,000 mLs Intravenous New Bag/Given 04/26/17 1611)     Initial Impression / Assessment and Plan / ED Course  I have reviewed the triage vital signs and the nursing notes.  Pertinent labs & imaging results that were available during my care of the patient were reviewed by me and considered in my medical decision making (see chart for details).     CT without acute pathology.  Symptoms controlled emergency department.  Home with nausea medication.  Urine shows no signs of infection.  Discharge home in good condition.  Final Clinical Impressions(s) / ED Diagnoses   Final diagnoses:  None     New Prescriptions New Prescriptions   IBUPROFEN (ADVIL,MOTRIN) 600 MG TABLET    Take 1 tablet (600 mg total) by mouth every 8 (eight) hours as needed.   ONDANSETRON (ZOFRAN ODT) 8 MG DISINTEGRATING TABLET    Take 1 tablet (8 mg total) by mouth every 8 (eight) hours as needed for nausea or vomiting.     Azalia Bilis, MD 04/26/17 [phone]

## 2017-04-26 NOTE — ED Triage Notes (Signed)
Patient states she began having urinary frequency on last Friday and lower abdominal pain radiating to back and side when she stretched. Now the pain is more frequent and constant. Denies pain with urination but does have urgency. Denies fever, nausea, or diarrhea. Patient is A&O x4 and ambulatory. Denies any blood in urine.

## 2017-05-24 ENCOUNTER — Ambulatory Visit (INDEPENDENT_AMBULATORY_CARE_PROVIDER_SITE_OTHER): Payer: Medicaid Other | Admitting: Physician Assistant

## 2017-09-20 ENCOUNTER — Ambulatory Visit (INDEPENDENT_AMBULATORY_CARE_PROVIDER_SITE_OTHER): Payer: Medicaid Other | Admitting: Physician Assistant

## 2017-09-20 ENCOUNTER — Encounter (INDEPENDENT_AMBULATORY_CARE_PROVIDER_SITE_OTHER): Payer: Self-pay | Admitting: Physician Assistant

## 2017-09-20 VITALS — BP 121/84 | HR 79 | Temp 98.1°F | Resp 18 | Ht 63.0 in | Wt 126.0 lb

## 2017-09-20 DIAGNOSIS — L309 Dermatitis, unspecified: Secondary | ICD-10-CM

## 2017-09-20 DIAGNOSIS — Z91199 Patient's noncompliance with other medical treatment and regimen due to unspecified reason: Secondary | ICD-10-CM

## 2017-09-20 DIAGNOSIS — F4000 Agoraphobia, unspecified: Secondary | ICD-10-CM

## 2017-09-20 DIAGNOSIS — F419 Anxiety disorder, unspecified: Secondary | ICD-10-CM

## 2017-09-20 DIAGNOSIS — Z9119 Patient's noncompliance with other medical treatment and regimen: Secondary | ICD-10-CM | POA: Diagnosis not present

## 2017-09-20 MED ORDER — TRIAMCINOLONE ACETONIDE 0.5 % EX OINT: 1.0000 "application " | TOPICAL_OINTMENT | Freq: Two times a day (BID) | CUTANEOUS | 0 refills | Status: DC

## 2017-09-20 MED ORDER — ESCITALOPRAM OXALATE 10 MG PO TABS: 10.0000 mg | ORAL_TABLET | Freq: Every day | ORAL | 2 refills | Status: DC

## 2017-09-20 MED ORDER — CLONAZEPAM 0.5 MG PO TABS: 0.5000 mg | ORAL_TABLET | Freq: Two times a day (BID) | ORAL | 0 refills | Status: DC

## 2017-09-20 NOTE — Patient Instructions (Addendum)
Community Resources  Advocacy/Legal Legal Aid [place]:  1-866-219-5262  /  336-272-0148  Family Justice Center:  336-641-7233  [facility] 24-hr Crisis line:  336-273-7273  Women's Resource Center, [place]:  336-275-6090  Court Watch (custody):  336-275-2346  [place] [facility]:   336-279-9299    Baby & Breastfeeding Car Seat Inspection @ Various [place] Fire Depts.- call 336-373-2177  [place] [facility]act[facility]  336-832-6860  [facility] 336-878-6712  WIC: 336-641-3663 ([place]);  336-641-7571 ([place])  La Leche League:  1-877-452-5321   Childcare [place] [facility]: 336-369-5097 ([place]) / 336-887-8224 ([place])  - Child Care Resources/ Referrals/ Scholarships  - Head Start/ Early Head Start (call or apply online)  [place] DHHS: [place] Pre-K :  1-800-859-0829 / 336-274-5437   Employment / Job Search [facility] [place]: 336-275-6090 / [address]  [place] [employer] (JobLink): 336-373-5922 ([place]) / 336-882-4141 ([place])  [facility] Resource/ Career Center: 336-275-9801 / 336-282-7307  [facility] Job & Career Center: 336-373-3764  DHHS Work First: 336-641-3447 ([place]) / 336-641-3447 ([place])  [facility] [place]:  336-676-5871   Financial Assistance [facility] [facility]:  336-553-2657  [employer]: 336-235-0368  [employer] (furniture):  336-370-4002  [place] [facility]: 336-373-4264  Low Income Energy Assistance  336-641-3000   Food Assistance DHHS- SNAP/ Food Stamps: 336-641-4588  WIC: [place]- 336-641-3663 ;  [place] 336-641-7571  Little Green Book- Free Meals  Little Blue Book- Free Food Pantries  During the summer, text "FOOD" to 877877   General Health / Clinics (Adults) Orange Card (for Adults) through [facility]: (336) 895-4900  [facility]:   336-832-8035  [facility]:   336-832-4444  Health Department:  336-641-3245  [facility]:  336-415-3877 / 336-641-2100  Planned [facility] of [place]:   336-373-0678  [facility]:   336-334-4822 x 50251   Housing [place] Housing Coalition:   336-691-9521  [place] Housing Authority:  336-275-8501  Affordable Housing Managemnt:  336-273-0568   Immigrant/ Refugee Center for [place]ians (UNCG):  336-256-1065  Fa[facility]:  336-379-0037  [employer]:  336-937-4701  [employer] World Services:  336-617-0381  African Services Coalition:  336-574-2677   LGBTQ YouthSAFE  www.youthsafe[place].org  PFLAG  336-541-6754 / info@[place].org  The Trevor Project:  1-866-488-7386   Mental Health/ Substance Use [facility]  336-387-6161  [facility]:  336-832-9700 or 1-800-711-2635  [facility] of Care:  336-271-5888  [facility]:  336-294-1349  [facility]:  336-542-2884  [facility] (walk-ins)  336-676-6840 / [address]  [place]:  800-449-1287  Alcoholics Anonymous:  336-854-4278  Narcotics Anonymous:  800-365-1036  Quit Smoking Hotline:  800-QUIT-NOW (800-784-8669)   Parenting Children's Home Society:  800-632-1400  [place]: Education Center & Support Groups:  336-832-6682  YWCA: 336-273-3461  UNCG: Bringing Out the Best:  336-334-3120               Thriving at Three (Hispanic families): 336-256-1066  Healthy Start ([facility]):  336-387-6161 x2288  Parents as Teachers:  336-691-0024  [place] [facility]- Learning Together (Immigrants): 336-369-5001   Poison Control 800-222-1222  Sports & Recreation [employer] Open Doors Application: ymcanwnc.org/join/open-doors-financial-assistance/  City of [place] Recreation Centers: http://www.[place].gov/index.aspx?page=3615   Special Needs Family Support Network:  336-832-6507  Autism Society of [place]:   336-333-0197 x1402 or x1412 /  800-785-1035  TEACCH :  336-334-5773     ARC of [place]:  336-373-1076  Children's Developmental Service Agency (CDSA):  336-334-5601  CC4C (Care Coordination for Children):  336-641-7641   Transportation Medicaid Transportation: 336-641-4848 to apply  [place] Transit Authority: 336-335-6499 (reduced-fare bus ID to Medicaid/ Medicare/ Orange Card)  SCAT Paratransit services: Eligible riders only, call 336-333-6589 for application   Tutoring/Mentoring Black Child Development Institute: 336-230-2138  Big Brothers/ Big Sisters: 336-378-9100 ([place])  336-882-4167 ([place])  ACES through child's school: 336-370-2321  [employer] Achievers: contact your local Y  SHIELD Mentor Program: 336-337-2771    Living With Anxiety After being diagnosed with an anxiety disorder, you may be relieved to know why you have felt or behaved a certain way. It is natural to also feel overwhelmed about the treatment ahead and what it will mean for your life. With care and support, you can manage this condition and recover from it. How to cope with anxiety Dealing with stress Stress is your body's reaction to life changes and events, both good and bad. Stress can last just a few hours or it can be ongoing. Stress can play a major role in anxiety, so it is important to learn both how to cope with stress and how to think about it differently. Talk with your health care provider or a counselor to learn more about stress reduction. He or she may suggest some stress reduction techniques, such as:  Music therapy. This can include creating or listening to music that you enjoy and that inspires you.  Mindfulness-based meditation. This involves being aware of your normal breaths, rather than trying to control your breathing. It can be done while sitting or walking.  Centering prayer. This is a kind of meditation that involves focusing on a word, phrase, or sacred image that is meaningful to you and that brings you peace.  Deep breathing. To do this, expand your stomach  and inhale slowly through your nose. Hold your breath for 3-5 seconds. Then exhale slowly, allowing your stomach muscles to relax.  Self-talk. This is a skill where you identify thought patterns that lead to anxiety reactions and correct those thoughts.  Muscle relaxation. This involves tensing muscles then relaxing them.  Choose a stress reduction technique that fits your lifestyle and personality. Stress reduction techniques take time and practice. Set aside 5-15 minutes a day to do them. Therapists can offer training in these techniques. The training may be covered by some insurance plans. Other things you can do to manage stress include:  Keeping a stress diary. This can help you learn what triggers your stress and ways to control your response.  Thinking about how you respond to certain situations. You may not be able to control everything, but you can control your reaction.  Making time for activities that help you relax, and not feeling guilty about spending your time in this way.  Therapy combined with coping and stress-reduction skills provides the best chance for successful treatment. Medicines Medicines can help ease symptoms. Medicines for anxiety include:  Anti-anxiety drugs.  Antidepressants.  Beta-blockers.  Medicines may be used as the main treatment for anxiety disorder, along with therapy, or if other treatments are not working. Medicines should be prescribed by a health care provider. Relationships Relationships can play a big part in helping you recover. Try to spend more time connecting with trusted friends and family members. Consider going to couples counseling, taking family education classes, or going to family therapy. Therapy can help you and others better understand the condition. How to recognize changes   in your condition Everyone has a different response to treatment for anxiety. Recovery from anxiety happens when symptoms decrease and stop interfering with  your daily activities at home or work. This may mean that you will start to:  Have better concentration and focus.  Sleep better.  Be less irritable.  Have more energy.  Have improved memory.  It is important to recognize when your condition is getting worse. Contact your health care provider if your symptoms interfere with home or work and you do not feel like your condition is improving. Where to find help and support: You can get help and support from these sources:  Self-help groups.  Online and community organizations.  A trusted spiritual leader.  Couples counseling.  Family education classes.  Family therapy.  Follow these instructions at home:  Eat a healthy diet that includes plenty of vegetables, fruits, whole grains, low-fat dairy products, and lean protein. Do not eat a lot of foods that are high in solid fats, added sugars, or salt.  Exercise. Most adults should do the following: ? Exercise for at least 150 minutes each week. The exercise should increase your heart rate and make you sweat (moderate-intensity exercise). ? Strengthening exercises at least twice a week.  Cut down on caffeine, tobacco, alcohol, and other potentially harmful substances.  Get the right amount and quality of sleep. Most adults need 7-9 hours of sleep each night.  Make choices that simplify your life.  Take over-the-counter and prescription medicines only as told by your health care provider.  Avoid caffeine, alcohol, and certain over-the-counter cold medicines. These may make you feel worse. Ask your pharmacist which medicines to avoid.  Keep all follow-up visits as told by your health care provider. This is important. Questions to ask your health care provider  Would I benefit from therapy?  How often should I follow up with a health care provider?  How long do I need to take medicine?  Are there any long-term side effects of my medicine?  Are there any alternatives to  taking medicine? Contact a health care provider if:  You have a hard time staying focused or finishing daily tasks.  You spend many hours a day feeling worried about everyday life.  You become exhausted by worry.  You start to have headaches, feel tense, or have nausea.  You urinate more than normal.  You have diarrhea. Get help right away if:  You have a racing heart and shortness of breath.  You have thoughts of hurting yourself or others. If you ever feel like you may hurt yourself or others, or have thoughts about taking your own life, get help right away. You can go to your nearest emergency department or call:  Your local emergency services (911 in the [place]  A suicide crisis helpline, such as the National Suicide Prevention Lifeline at 1-800-273-8255. This is open 24-hours a day.  Summary  Taking steps to deal with stress can help calm you.  Medicines cannot cure anxiety disorders, but they can help ease symptoms.  Family, friends, and partners can play a big part in helping you recover from an anxiety disorder. This information is not intended to replace advice given to you by your health care provider. Make sure you discuss any questions you have with your health care provider. Document Released: 07/06/2016 Document Revised: 07/06/2016 Document Reviewed: 07/06/2016 Elsevier Interactive Patient Education  2018 Elsevier Inc.  

## 2017-09-20 NOTE — Progress Notes (Signed)
Subjective:  Patient ID: Brooke Patel, female    DOB: Nov 07, 1995  Age: 22 y.o. MRN: [id number]  CC: annual exam  HPI Brooke Patel a 22 y.o.femalewith a PMH of anemia and anxiety presents to f/u on anxiety. Has not taken Clonazepam and Escitalopram once again as directed. Was also noncompliant 5 months ago at her last visit for anxiety. Says her anxiety is out of control and she is scared to take her medications. Of note, said she felt anxiety relief when she filled her medications but she has never taken any of the tablets. Just knowing she had treatment available seemed to have relieved . She is now fearful to drive her daughter to school. Also had chills, thirst, and SOB when she was in a crowded place. Does not want to go outside because she is fearful of having an anxiety attack. Reports fatigue and nausea when she eats. Sleeps well. Thinks her glucose has been dropping. Used her friends glucometer and conducting a fasting measurement that resulted as 117. Does not know if this is normal. Reports sleeping well.      Outpatient Medications Prior to Visit  Medication Sig Dispense Refill  . acetaminophen (TYLENOL) 325 MG tablet Take 325-650 mg by mouth 2 (two) times daily as needed for moderate pain.    . carbamide peroxide (DEBROX) 6.5 % OTIC solution Place 5 drops into the right ear 2 (two) times daily. 15 mL 0  . clonazePAM (KLONOPIN) 0.5 MG tablet Take 1 tablet (0.5 mg total) by mouth at bedtime. 30 tablet 0  . ibuprofen (ADVIL,MOTRIN) 600 MG tablet Take 1 tablet (600 mg total) by mouth every 8 (eight) hours as needed. 15 tablet 0  . Cholecalciferol (VITAMIN D-3) 5000 units TABS Take 1 tablet by mouth daily. (Patient not taking: Reported on 04/26/2017) 30 tablet 0  . ciprofloxacin (CIPRO) 500 MG tablet Take 500 mg by mouth 2 (two) times daily.    Marland Kitchen escitalopram (LEXAPRO) 10 MG tablet Take 1 tablet (10 mg total) by mouth daily. (Patient not taking: Reported on 04/26/2017) 30 tablet 2   . ferrous sulfate (FERROUSUL) 325 (65 FE) MG tablet Take 1 tablet (325 mg total) by mouth daily with breakfast. (Patient not taking: Reported on 04/27/2017) 30 tablet 11  . naproxen (NAPROSYN) 500 MG tablet Take 1 tablet (500 mg total) by mouth 2 (two) times daily with a meal. (Patient not taking: Reported on 04/26/2017) 30 tablet 0  . ondansetron (ZOFRAN ODT) 8 MG disintegrating tablet Take 1 tablet (8 mg total) by mouth every 8 (eight) hours as needed for nausea or vomiting. 10 tablet 0   No facility-administered medications prior to visit.      ROS Review of Systems  Constitutional: Negative for chills, fever and malaise/fatigue.  Eyes: Negative for blurred vision.  Respiratory: Positive for shortness of breath.   Cardiovascular: Negative for chest pain and palpitations.  Gastrointestinal: Positive for nausea. Negative for abdominal pain.  Genitourinary: Negative for dysuria and hematuria.  Musculoskeletal: Negative for joint pain and myalgias.  Skin: Negative for rash.  Neurological: Negative for tingling and headaches.  Psychiatric/Behavioral: Negative for depression. The patient is nervous/anxious.     Objective:  BP 121/84 (BP Location: Right Arm, Patient Position: Sitting, Cuff Size: Normal)   Pulse 79   Temp 98.1 F (36.7 C) (Oral)   Resp 18   Ht 5\' 3"  (1.6 m)   Wt 126 lb (57.2 kg)   LMP 08/22/2017   SpO2 100%   BMI  22.32 kg/m   BP/Weight 09/20/2017 04/26/2017 03/29/2017  Systolic BP 121 112 111  Diastolic BP 84 63 67  Wt. (Lbs) 126 120 120  BMI 22.32 21.26 21.26      Physical Exam  Constitutional: She is oriented to person, place, and time.  Well developed, well nourished, NAD, polite  HENT:  Head: Normocephalic and atraumatic.  Eyes: No scleral icterus.  Neck: Normal range of motion. Neck supple. No thyromegaly present.  Cardiovascular: Normal rate, regular rhythm and normal heart sounds.  Pulmonary/Chest: Effort normal and breath sounds normal.  Abdominal:  Soft. Bowel sounds are normal. There is no tenderness.  Musculoskeletal: She exhibits no edema.  Neurological: She is alert and oriented to person, place, and time.  Skin: Skin is warm and dry. No rash noted. No erythema. No pallor.  Few tiny scaled papules under the right lower eyelid  Psychiatric: She has a normal mood and affect. Her behavior is normal. Thought content normal.  Vitals reviewed.    Assessment & Plan:    1. Anxiety - clonazePAM (KLONOPIN) 0.5 MG tablet; Take 1 tablet (0.5 mg total) by mouth 2 (two) times daily.  Dispense: 40 tablet; Refill: 0 - escitalopram (LEXAPRO) 10 MG tablet; Take 1 tablet (10 mg total) by mouth daily.  Dispense: 30 tablet; Refill: 2  2. Agoraphobia - clonazePAM (KLONOPIN) 0.5 MG tablet; Take 1 tablet (0.5 mg total) by mouth 2 (two) times daily.  Dispense: 40 tablet; Refill: 0 - escitalopram (LEXAPRO) 10 MG tablet; Take 1 tablet (10 mg total) by mouth daily.  Dispense: 30 tablet; Refill: 2  3. Dermatitis - triamcinolone ointment (KENALOG) 0.5 %; Apply 1 application topically 2 (two) times daily.  Dispense: 30 g; Refill: 0  4. Non-compliant patient   Meds ordered this encounter  Medications  . clonazePAM (KLONOPIN) 0.5 MG tablet    Sig: Take 1 tablet (0.5 mg total) by mouth 2 (two) times daily.    Dispense:  40 tablet    Refill:  0    Order Specific Question:   Supervising Provider    Answer:   Quentin AngstJEGEDE, OLUGBEMIGA E [id number]]  . escitalopram (LEXAPRO) 10 MG tablet    Sig: Take 1 tablet (10 mg total) by mouth daily.    Dispense:  30 tablet    Refill:  2    Order Specific Question:   Supervising Provider    Answer:   Quentin AngstJEGEDE, OLUGBEMIGA E [id number]]  . triamcinolone ointment (KENALOG) 0.5 %    Sig: Apply 1 application topically 2 (two) times daily.    Dispense:  30 g    Refill:  0    Order Specific Question:   Supervising Provider    Answer:   Quentin AngstJEGEDE, OLUGBEMIGA E [[id number]]    Follow-up: Return in about 4 weeks (around 10/18/2017).    Loletta Specteroger David Gomez PA

## 2017-10-18 ENCOUNTER — Ambulatory Visit (INDEPENDENT_AMBULATORY_CARE_PROVIDER_SITE_OTHER): Payer: Medicaid Other | Admitting: Physician Assistant

## 2018-02-17 DIAGNOSIS — F321 Major depressive disorder, single episode, moderate: Secondary | ICD-10-CM | POA: Insufficient documentation

## 2018-02-17 DIAGNOSIS — F411 Generalized anxiety disorder: Secondary | ICD-10-CM | POA: Insufficient documentation

## 2018-02-17 HISTORY — DX: Major depressive disorder, single episode, moderate: F32.1

## 2018-02-17 HISTORY — DX: Generalized anxiety disorder: F41.1

## 2019-03-14 LAB — PREGNANCY, URINE: Preg Test, Ur: POSITIVE

## 2019-03-15 ENCOUNTER — Encounter: Payer: Self-pay | Admitting: *Deleted

## 2019-03-15 ENCOUNTER — Telehealth: Payer: Self-pay | Admitting: Obstetrics and Gynecology

## 2019-03-15 NOTE — Telephone Encounter (Signed)
Called the patient in regards to the upcoming appointment. Left a voicemail message informing the patient of calling our office as the voicemail was generic. Mailing an appointment reminder letter.

## 2019-03-19 ENCOUNTER — Telehealth: Payer: Self-pay | Admitting: Family Medicine

## 2019-03-19 NOTE — Telephone Encounter (Signed)
Patient called in to confirm her appointment on 9/14 @ 8:30. Patient instructed that the visit is a telephone visit and she does not have to come to the office for this appointment. Patient verbalized understanding.

## 2019-03-21 ENCOUNTER — Telehealth: Payer: Self-pay | Admitting: *Deleted

## 2019-03-21 NOTE — Telephone Encounter (Signed)
I called Brooke Patel and explained I was calling because the provider she is going to see wanted to check on her at home. I asked if she was still having the cramping that she was having when she saw provider at [facility]. She reports she is not having as much crampin.  She reports still having nausea and is using zofran as needed.  She denies any burning or pain with urination but does report her urine is dark yellow at times. We discussed this indicates she is not getting enough fluid. We discussed taking zofran as needed and trying to take sips of water /fluid often and anything that becomes liquid like jello, popsicles- whatever she can keep down. I advised her if she can't keep fluids down and starts to feel dizzy, lightheaded to go to the Hospital for fluids.  I also informed her she can call  us if she has questions. She voices understanding.  Linda,RN

## 2019-03-21 NOTE — Telephone Encounter (Signed)
-----   Message from Virginia Rochester, NP sent at 03/20/2019  6:20 PM EDT ----- Regarding: New Patient Referral was sent to me from an outside MD about this patient who has an appointment on 9-14 for intake and 9-28 for a visit with me.  I was not able to send a MyChart message to her as her account is not yet activated.  She is having lots of cramping and has been given Zofran by her MD.  Thought we could check with her to see if she is continuing to have cramping?  Is she able to drink 64 oz daily?  Does she need to come in for a urinalysis to check to see if she has a UTI.  Thanks so much for your help with this.  I though several weeks to wait was a long time if she has a problem right now. Terri

## 2019-03-29 NOTE — Telephone Encounter (Signed)
Opened in error

## 2019-04-06 ENCOUNTER — Telehealth: Payer: Self-pay | Admitting: Obstetrics & Gynecology

## 2019-04-06 NOTE — Telephone Encounter (Signed)
Called the patient to inform of the upcoming appointment. Advised of downloading the [employer]. Sent the patient a mychart link via text message. She stated she has the app downloaded for her other doctor but now ours. Informed the patient of choosing [facility] as an organization. Advised of the expecting a phone call and downloading the app prior to the appointment.

## 2019-04-09 ENCOUNTER — Telehealth (INDEPENDENT_AMBULATORY_CARE_PROVIDER_SITE_OTHER): Payer: Medicaid Other | Admitting: *Deleted

## 2019-04-09 ENCOUNTER — Other Ambulatory Visit: Payer: Self-pay

## 2019-04-09 DIAGNOSIS — D649 Anemia, unspecified: Secondary | ICD-10-CM | POA: Insufficient documentation

## 2019-04-09 DIAGNOSIS — F321 Major depressive disorder, single episode, moderate: Secondary | ICD-10-CM

## 2019-04-09 DIAGNOSIS — Z3490 Encounter for supervision of normal pregnancy, unspecified, unspecified trimester: Secondary | ICD-10-CM

## 2019-04-09 DIAGNOSIS — F411 Generalized anxiety disorder: Secondary | ICD-10-CM

## 2019-04-09 DIAGNOSIS — D508 Other iron deficiency anemias: Secondary | ICD-10-CM

## 2019-04-09 HISTORY — DX: Encounter for supervision of normal pregnancy, unspecified, unspecified trimester: Z34.90

## 2019-04-09 MED ORDER — BLOOD PRESSURE KIT DEVI: 1.0000 | 0 refills | Status: AC | PRN

## 2019-04-09 NOTE — Progress Notes (Addendum)
I connected with  Brooke Patel on 04/09/19 at  8:30 AM EDT by telephone and verified that I am speaking with the correct person using two identifiers.   I discussed the limitations, risks, security and privacy concerns of performing an evaluation and management service by telephone and the availability of in person appointments. I also discussed with the patient that there may be a patient responsible charge related to this service. The patient expressed understanding and agreed to proceed.  We confirmed her EDD based on sure LMP.  Explained I am completing her New OB Intake today.I reviewed her Medical History, etc. I  Explained we will send her [employer] app- app sent to her while on phone.  Explained we will send a blood pressure cuff to her and the pharmacy that will send it will call her to verify her address. I asked her to bring the blood pressure cuff with her to her first ob appointment so we can show her how to use it. Explained  then we will have her take her blood pressure weekly and enter into the app. Explained she will have some visits in office and some virtually. She already has Community education officer. Reviewed appointment date/ time with her , our location and to wear mask, no visitors. Explained she will have exam, ob bloodwork, hemoglobin a1C, cbg , genetic testing if desired, pap if needed. Explained we will schedule an [place] at 19 weeks and she will get notification via [place]. She voices understanding.   Linda,RN 04/09/2019  8:31 AM    Chart reviewed for nurse visit. Agree with plan of care.   Virginia Rochester, NP 04/10/2019 5:02 PM

## 2019-04-20 ENCOUNTER — Telehealth: Payer: Self-pay | Admitting: Nurse Practitioner

## 2019-04-20 NOTE — BH Specialist Note (Signed)
Pt left prior to being seen; called and left HIPPA-compliant message to call back Roselyn Reef from [employer] for [employer] at [phone], and left MyChart message.   Integrated Behavioral Health Initial Visit  MRN: [id number] Name: Tereka Thorley [facility], LCSW

## 2019-04-20 NOTE — Telephone Encounter (Signed)
Spoke to patient about her appointment on 9/28 @ 9:35. Patient instructed to wear a face mask for the entire appointment and no visitors are allowed with her during the visit. Patient screened for covid symptoms and denied having any °

## 2019-04-23 ENCOUNTER — Other Ambulatory Visit (HOSPITAL_COMMUNITY)
Admission: RE | Admit: 2019-04-23 | Discharge: 2019-04-23 | Disposition: A | Discharge status: Home / Self Care | Payer: Medicaid Other | Source: Ambulatory Visit | Attending: Nurse Practitioner | Admitting: Nurse Practitioner

## 2019-04-23 ENCOUNTER — Ambulatory Visit (INDEPENDENT_AMBULATORY_CARE_PROVIDER_SITE_OTHER): Payer: Medicaid Other | Admitting: Nurse Practitioner

## 2019-04-23 ENCOUNTER — Encounter: Payer: Self-pay | Admitting: Nurse Practitioner

## 2019-04-23 ENCOUNTER — Other Ambulatory Visit: Payer: Self-pay

## 2019-04-23 ENCOUNTER — Ambulatory Visit: Payer: Medicaid Other | Admitting: Clinical

## 2019-04-23 VITALS — BP 122/70 | HR 82 | Wt 128.3 lb

## 2019-04-23 DIAGNOSIS — F411 Generalized anxiety disorder: Secondary | ICD-10-CM | POA: Diagnosis not present

## 2019-04-23 DIAGNOSIS — Z3492 Encounter for supervision of normal pregnancy, unspecified, second trimester: Secondary | ICD-10-CM | POA: Insufficient documentation

## 2019-04-23 DIAGNOSIS — Z23 Encounter for immunization: Secondary | ICD-10-CM | POA: Diagnosis not present

## 2019-04-23 DIAGNOSIS — R51 Headache: Secondary | ICD-10-CM

## 2019-04-23 DIAGNOSIS — R519 Headache, unspecified: Secondary | ICD-10-CM

## 2019-04-23 NOTE — Progress Notes (Signed)
Subjective:   Brooke Patel is a 23 y.o. G3P2002 at 36w4dby LMP being seen today for her first obstetrical visit.  Her obstetrical history is significant for past history of depression and anxiety and has not been taking any medication for these in this pregnancy.. Patient does intend to breast feed. She has not breastfed with previous pregnancies.  Pregnancy history fully reviewed.  Patient reports vomiting once a day and then is able to eat without vomiting.  Having headaches daily..Marland Kitchen HISTORY: OB History  Gravida Para Term Preterm AB Living  _0 0 0 2  SAB TAB Ectopic Multiple Live Births  0 0 0 0 2    # Outcome Date GA Lbr Len/2nd Weight Sex Delivery Anes PTL Lv  3 Current           2 Term 2014 424w0d6 lb (2.722 kg) F Vag-Spont Local  LIV     Birth Comments: no complications  1 Term 0943/32/95117w1d:13 / 00:36 6 lb 9.6 oz (2.995 kg) F Vag-Spont Local  LIV     Birth Comments: no anomalies or problems     Name: Potts,GIRL Aalyah     Apgar1: 9  Apgar5: 9   Past Medical History:  Diagnosis Date  . Anemia   . Anemia   . No pertinent past medical history    Past Surgical History:  Procedure Laterality Date  . NO PAST SURGERIES     Family History  Problem Relation Age of Onset  . Diabetes Paternal Grandmother   . Cancer Paternal Grandmother    Social History   Tobacco Use  . Smoking status: Never Smoker  . Smokeless tobacco: Never Used  Substance Use Topics  . Alcohol use: No  . Drug use: No   No Known Allergies Current Outpatient Medications on File Prior to Visit  Medication Sig Dispense Refill  . acetaminophen (TYLENOL) 325 MG tablet Take 325-650 mg by mouth 2 (two) times daily as needed for moderate pain.    . Blood Pressure Monitoring (BLOOD PRESSURE KIT) DEVI 1 Device by Does not apply route as needed. ICD 10 Z34.90 1 Device 0  . Prenatal Vit-Fe Fumarate-FA (PRENATAL VITAMIN) 27-0.8 MG TABS Take by mouth.     No current facility-administered  medications on file prior to visit.      Exam   Vitals:   04/23/19 1026  BP: 122/70  Pulse: 82  Weight: 128 lb 4.8 oz (58.2 kg)   Fetal Heart Rate (bpm): 156  Uterus:  Fundal Height: 16 cm  Pelvic Exam: Perineum: no hemorrhoids, normal perineum   Vulva: normal external genitalia, no lesions   Vagina:  normal mucosa, normal discharge   Cervix: no lesions and normal, closed   Adnexa: normal adnexa and no mass, fullness, tenderness   Bony Pelvis: average  System: General: well-developed, well-nourished female in no acute distress   Breast:  normal appearance, no masses or tenderness   Skin: normal coloration and turgor, no rashes   Neurologic: oriented, normal, negative, normal mood   Extremities: normal strength, tone, and muscle mass, ROM of all joints is normal   HEENT extraocular movement intact and sclera clear, anicteric   Mouth/Teeth mucous membranes moist, pharynx normal without lesions and dental hygiene good   Neck supple and no masses, normal thyroid   Cardiovascular: regular rate and rhythm   Respiratory:  no respiratory distress, normal breath sounds   Abdomen: soft, non-tender; no masses,  no  organomegaly     Assessment:   Pregnancy: [id number] Patient Active Problem List   Diagnosis Date Noted  . Supervision of low-risk pregnancy 04/09/2019  . Anemia   . 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 GAD (generalized anxiety disorder) 02/17/2018     Plan:  1. Encounter for supervision of low-risk pregnancy in second trimester Pap smear not due until next year Flu shot given Prenatal vitamins ordered. Advised online breastfeeding class as this is her first time breastfeeding.  - Culture, OB Urine - Genetic Screening - Obstetric Panel, Including HIV - Hemoglobin A1c - Flu Vaccine QUAD 36+ mos IM - Cervicovaginal ancillary only( [place]) - Ambulatory referral to [place]  2. GAD (generalized anxiety disorder) Is not using any  medication for depression or anxiety. Not able to see [facility] provider in the office today.  Will schedule MyChart visit for [facility].  - Ambulatory referral to [place]  3.  Headaches Reviewed taking medicatiton for headaches which she has not been doing.  Advised tylenol. Also recommended appropriate fluids daily and eating protein at all meals to keep blood sugar steady.  Do not skip meals.  Initial labs drawn. Continue prenatal vitamins. Genetic Screening discussed, Horizon and Panorama : ordered. Ultrasound discussed; fetal anatomic survey: ordered. Problem list reviewed and updated. The nature of [facility] with multiple MDs and other Advanced Practice Providers was explained to patient; also emphasized that residents, students are part of our team. Routine obstetric precautions reviewed. Return in about 4 weeks (around 05/21/2019) for video visit.  Total face-to-face time with patient: 40 minutes.  Over 50% of encounter was spent on counseling and coordination of care.     Earlie Server, FNP Family Nurse Practitioner, [facility] for [employer], [facility] 04/23/2019 11:10 AM

## 2019-04-23 NOTE — Patient Instructions (Signed)
Drink at least 8 8-oz glasses of water every day.   Safe Medications in Pregnancy   Acne: Benzoyl Peroxide Salicylic Acid  Backache/Headache: Tylenol: 2 regular strength every 4 hours OR              2 Extra strength every 6 hours  Colds/Coughs/Allergies: Benadryl (alcohol free) 25 mg every 6 hours as needed Breath right strips Claritin Cepacol throat lozenges Chloraseptic throat spray Cold-Eeze- up to three times per day Cough drops, alcohol free Flonase  Guaifenesin Mucinex Robitussin DM (plain only, alcohol free) Saline nasal spray/drops Sudafed (pseudoephedrine) & Actifed ** use only after [redacted] weeks gestation and if you do not have high blood pressure Tylenol Vicks Vaporub Zinc lozenges Zyrtec   Constipation: Colace Ducolax suppositories Fleet enema Glycerin suppositories Metamucil Milk of magnesia Miralax Senokot Smooth move tea  Diarrhea: Kaopectate Imodium A-D  *NO pepto Bismol  Hemorrhoids: Anusol Anusol HC Preparation H Tucks  Indigestion: Tums Maalox Mylanta Zantac  Pepcid  Insomnia: Benadryl (alcohol free) 25mg  every 6 hours as needed Tylenol PM Unisom, no Gelcaps  Leg Cramps: Tums MagGel  Nausea/Vomiting:  Bonine Dramamine Emetrol Ginger extract Sea bands Meclizine  Nausea medication to take during pregnancy:  Unisom (doxylamine succinate 25 mg tablets) Take one tablet daily at bedtime. If symptoms are not adequately controlled, the dose can be increased to a maximum recommended dose of two tablets daily (1/2 tablet in the morning, 1/2 tablet mid-afternoon and one at bedtime). Vitamin B6 100mg  tablets. Take one tablet twice a day (up to 200 mg per day).  Skin Rashes: Aveeno products Benadryl cream or 25mg  every 6 hours as needed Calamine Lotion 1% cortisone cream  Yeast infection: Gyne-lotrimin 7 Monistat 7   **If taking multiple medications, please check labels to avoid duplicating the same active  ingredients **take medication as directed on the label ** Do not exceed 4000 mg of tylenol in 24 hours **Do not take medications that contain aspirin or ibuprofen

## 2019-04-23 NOTE — Progress Notes (Signed)
cer

## 2019-04-24 LAB — OBSTETRIC PANEL, INCLUDING HIV
Antibody Screen: NEGATIVE
Basophils Absolute: 0 10*3/uL (ref 0.0–0.2)
Basos: 0 %
EOS (ABSOLUTE): 0.4 10*3/uL (ref 0.0–0.4)
Eos: 4 %
HIV Screen 4th Generation wRfx: NONREACTIVE
Hematocrit: 35 % (ref 34.0–46.6)
Hemoglobin: 11.7 g/dL (ref 11.1–15.9)
Hepatitis B Surface Ag: NEGATIVE
Immature Grans (Abs): 0 10*3/uL (ref 0.0–0.1)
Immature Granulocytes: 0 %
Lymphocytes Absolute: 1.9 10*3/uL (ref 0.7–3.1)
Lymphs: 20 %
MCH: 30.1 pg (ref 26.6–33.0)
MCHC: 33.4 g/dL (ref 31.5–35.7)
MCV: 90 fL (ref 79–97)
Monocytes Absolute: 0.4 10*3/uL (ref 0.1–0.9)
Monocytes: 5 %
Neutrophils Absolute: 6.6 10*3/uL (ref 1.4–7.0)
Neutrophils: 71 %
Platelets: 191 10*3/uL (ref 150–450)
RBC: 3.89 x10E6/uL (ref 3.77–5.28)
RDW: 13.8 % (ref 11.7–15.4)
RPR Ser Ql: NONREACTIVE
Rh Factor: POSITIVE
Rubella Antibodies, IGG: 1.55 index (ref >0.99)
WBC: 9.4 10*3/uL (ref 3.4–10.8)

## 2019-04-24 LAB — CERVICOVAGINAL ANCILLARY ONLY
Chlamydia: NEGATIVE
Molecular Disclaimer: NEGATIVE
Molecular Disclaimer: NORMAL
Neisseria Gonorrhea: NEGATIVE

## 2019-04-24 LAB — HEMOGLOBIN A1C
Est. average glucose Bld gHb Est-mCnc: 91 mg/dL
Hgb A1c MFr Bld: 4.8 % (ref 4.8–5.6)

## 2019-04-24 NOTE — Progress Notes (Unsigned)
Medicaid Home Form Completed-04/23/19

## 2019-04-25 LAB — CULTURE, OB URINE

## 2019-04-25 LAB — URINE CULTURE, OB REFLEX: Organism ID, Bacteria: NO GROWTH

## 2019-04-30 ENCOUNTER — Other Ambulatory Visit: Payer: Self-pay

## 2019-04-30 ENCOUNTER — Ambulatory Visit: Payer: Medicaid Other | Admitting: Clinical

## 2019-04-30 ENCOUNTER — Telehealth: Payer: Self-pay | Admitting: Clinical

## 2019-04-30 ENCOUNTER — Encounter: Payer: Self-pay | Admitting: Clinical

## 2019-04-30 DIAGNOSIS — Z91199 Patient's noncompliance with other medical treatment and regimen due to unspecified reason: Secondary | ICD-10-CM

## 2019-04-30 DIAGNOSIS — Z5329 Procedure and treatment not carried out because of patient's decision for other reasons: Secondary | ICD-10-CM

## 2019-04-30 NOTE — Telephone Encounter (Signed)
Attempted to call patient to get her rescheduled for her visit with Roselyn Reef. No answer, left voicemail instructing patient to give the office a call back to be rescheduled. No show letter mailed

## 2019-04-30 NOTE — BH Specialist Note (Signed)
Pt did not arrive to video visit and did not answer the phone: voicemail is full, so unable to leave phone message; left MyChart message for patient.  [place] via Telemedicine Video Visit  04/30/2019 Liberta Gimpel [phone]  [place]

## 2019-05-03 ENCOUNTER — Encounter: Payer: Self-pay | Admitting: *Deleted

## 2019-05-11 ENCOUNTER — Encounter: Payer: Self-pay | Admitting: Nurse Practitioner

## 2019-05-11 ENCOUNTER — Other Ambulatory Visit: Payer: Self-pay | Admitting: Nurse Practitioner

## 2019-05-11 ENCOUNTER — Telehealth: Payer: Self-pay | Admitting: *Deleted

## 2019-05-11 ENCOUNTER — Encounter: Payer: Self-pay | Admitting: *Deleted

## 2019-05-11 DIAGNOSIS — Z148 Genetic carrier of other disease: Secondary | ICD-10-CM | POA: Insufficient documentation

## 2019-05-11 NOTE — Progress Notes (Signed)
Horizon results back.  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  Clinical staff to contact client.  Ambulatory referral order for genetic counseling done.  Earlie Server, RN, MSN, NP-BC Nurse Practitioner, [facility] for [employer], [facility] 05/11/2019 12:34 PM

## 2019-05-11 NOTE — Telephone Encounter (Signed)
I called [place] to review her Horizon results which show she is a carrier for Polycystic Kidney disease, Autosomal recessive. I left a message I am calling to review non urgent results and since I did not reach her by phone I will send a detailed Mychart message. Please message [place] back or call if you have questions.  Linda,RN

## 2019-05-14 ENCOUNTER — Encounter: Payer: Self-pay | Admitting: *Deleted

## 2019-05-20 NOTE — Progress Notes (Signed)
RN attempted to contact patient x2, no answer. RN left message for patient to reschedule visit.  , Gerrie Nordmann, NP  10:40 AM 05/21/2019

## 2019-05-21 ENCOUNTER — Telehealth: Payer: Self-pay | Admitting: Women's Health

## 2019-05-21 ENCOUNTER — Encounter: Payer: Self-pay | Admitting: Women's Health

## 2019-05-21 ENCOUNTER — Telehealth: Payer: Medicaid Other | Admitting: Women's Health

## 2019-05-21 ENCOUNTER — Other Ambulatory Visit: Payer: Self-pay

## 2019-05-21 NOTE — Telephone Encounter (Signed)
Attempted to contact patient to get her rescheduled for her missed OB appointment. No answer, left voicemail for patient to give the office a call back. No show letter mailed.

## 2019-05-21 NOTE — Progress Notes (Signed)
9:53a-Called Pt for My Chart visit, no answer, left VM that will call back in 10 min.  10:27A- 2nd attempt, no answer, left VM to reschedule appointment.

## 2019-05-24 ENCOUNTER — Other Ambulatory Visit (HOSPITAL_COMMUNITY): Payer: Self-pay | Admitting: *Deleted

## 2019-05-24 ENCOUNTER — Other Ambulatory Visit: Payer: Self-pay | Admitting: Nurse Practitioner

## 2019-05-24 ENCOUNTER — Ambulatory Visit (HOSPITAL_COMMUNITY)
Admission: RE | Admit: 2019-05-24 | Discharge: 2019-05-24 | Disposition: A | Discharge status: Home / Self Care | Payer: Medicaid Other | Source: Ambulatory Visit | Attending: Nurse Practitioner | Admitting: Nurse Practitioner

## 2019-05-24 ENCOUNTER — Other Ambulatory Visit: Payer: Self-pay

## 2019-05-24 DIAGNOSIS — Z3A19 19 weeks gestation of pregnancy: Secondary | ICD-10-CM | POA: Diagnosis not present

## 2019-05-24 DIAGNOSIS — Z3490 Encounter for supervision of normal pregnancy, unspecified, unspecified trimester: Secondary | ICD-10-CM | POA: Diagnosis present

## 2019-05-24 DIAGNOSIS — Z363 Encounter for antenatal screening for malformations: Secondary | ICD-10-CM

## 2019-05-24 DIAGNOSIS — Z362 Encounter for other antenatal screening follow-up: Secondary | ICD-10-CM

## 2019-06-05 ENCOUNTER — Encounter (HOSPITAL_COMMUNITY): Payer: Self-pay

## 2019-06-05 ENCOUNTER — Ambulatory Visit (HOSPITAL_COMMUNITY): Payer: Medicaid Other | Attending: Obstetrics and Gynecology

## 2019-06-25 ENCOUNTER — Other Ambulatory Visit: Payer: Self-pay

## 2019-06-25 ENCOUNTER — Other Ambulatory Visit (HOSPITAL_COMMUNITY): Payer: Self-pay | Admitting: *Deleted

## 2019-06-25 ENCOUNTER — Encounter (HOSPITAL_COMMUNITY): Payer: Self-pay

## 2019-06-25 ENCOUNTER — Ambulatory Visit (HOSPITAL_COMMUNITY)
Admission: RE | Admit: 2019-06-25 | Discharge: 2019-06-25 | Disposition: A | Discharge status: Home / Self Care | Payer: Medicaid Other | Source: Ambulatory Visit | Attending: Obstetrics and Gynecology | Admitting: Obstetrics and Gynecology

## 2019-06-25 ENCOUNTER — Ambulatory Visit (HOSPITAL_COMMUNITY): Payer: Medicaid Other

## 2019-06-25 DIAGNOSIS — Z362 Encounter for other antenatal screening follow-up: Secondary | ICD-10-CM | POA: Insufficient documentation

## 2019-06-25 DIAGNOSIS — Z3A23 23 weeks gestation of pregnancy: Secondary | ICD-10-CM | POA: Diagnosis not present

## 2019-06-25 DIAGNOSIS — Z148 Genetic carrier of other disease: Secondary | ICD-10-CM

## 2019-07-27 NOTE — L&D Delivery Note (Signed)
OB/GYN Faculty Practice Delivery Note  Brooke Patel is a 24 y.o. [id number] s/p NSVD at [redacted]w[redacted]d. She was admitted for latent labor.   ROM: 0h 6m with clear fluid GBS Status: negative Maximum Maternal Temperature: 98.4*F  Labor Progress: Patient admitted in latent labor. Pitocin was added for augmentation after no cervical change. She SROMed, progressed to complete, and delivered shortly after.  Delivery Date/Time: 10/24/19, 4353 hours Delivery: Called to room and patient was complete and pushing. Head delivered LOA. No nuchal cord present. Shoulder and body delivered in usual fashion. Infant with spontaneous cry, placed on mother's abdomen, dried and stimulated. Cord clamped x 2 after 1-minute delay, and cut by FOB under my direct supervision. Cord blood drawn. Placenta delivered spontaneously with gentle cord traction. Fundus firm with massage and Pitocin. Labia, perineum, vagina, and cervix were inspected, and found to be intact.   Placenta: 3 vessel cord, intact, to L&D Complications: none Lacerations: none EBL: 50 mL Analgesia: none  Postpartum Planning [x]  message to sent to schedule follow-up  [x]  vaccines UTD  Infant: female  APGARs 9,9  weight per medical record  , DO OB/GYN Fellow, Faculty Practice

## 2019-08-06 ENCOUNTER — Ambulatory Visit (HOSPITAL_COMMUNITY)
Admission: RE | Admit: 2019-08-06 | Discharge: 2019-08-06 | Disposition: A | Discharge status: Home / Self Care | Payer: Medicaid Other | Source: Ambulatory Visit | Attending: Obstetrics and Gynecology | Admitting: Obstetrics and Gynecology

## 2019-08-06 ENCOUNTER — Other Ambulatory Visit: Payer: Self-pay

## 2019-08-06 DIAGNOSIS — Z148 Genetic carrier of other disease: Secondary | ICD-10-CM | POA: Insufficient documentation

## 2019-08-06 DIAGNOSIS — Z362 Encounter for other antenatal screening follow-up: Secondary | ICD-10-CM

## 2019-08-06 DIAGNOSIS — Z3A29 29 weeks gestation of pregnancy: Secondary | ICD-10-CM

## 2019-09-27 ENCOUNTER — Other Ambulatory Visit: Payer: Self-pay

## 2019-09-27 ENCOUNTER — Other Ambulatory Visit (HOSPITAL_COMMUNITY)
Admission: RE | Admit: 2019-09-27 | Discharge: 2019-09-27 | Disposition: A | Discharge status: Home / Self Care | Payer: Medicaid Other | Source: Ambulatory Visit | Attending: Obstetrics and Gynecology | Admitting: Obstetrics and Gynecology

## 2019-09-27 ENCOUNTER — Ambulatory Visit (INDEPENDENT_AMBULATORY_CARE_PROVIDER_SITE_OTHER): Payer: Medicaid Other | Admitting: Obstetrics and Gynecology

## 2019-09-27 VITALS — BP 128/85 | HR 96 | Wt 140.0 lb

## 2019-09-27 DIAGNOSIS — Z148 Genetic carrier of other disease: Secondary | ICD-10-CM | POA: Diagnosis not present

## 2019-09-27 DIAGNOSIS — Z3492 Encounter for supervision of normal pregnancy, unspecified, second trimester: Secondary | ICD-10-CM | POA: Insufficient documentation

## 2019-09-27 DIAGNOSIS — Z23 Encounter for immunization: Secondary | ICD-10-CM | POA: Diagnosis not present

## 2019-09-27 DIAGNOSIS — O0933 Supervision of pregnancy with insufficient antenatal care, third trimester: Secondary | ICD-10-CM | POA: Diagnosis present

## 2019-09-27 DIAGNOSIS — Z3A37 37 weeks gestation of pregnancy: Secondary | ICD-10-CM | POA: Diagnosis not present

## 2019-09-27 NOTE — Progress Notes (Signed)
   PRENATAL VISIT NOTE  Subjective:  Brooke Patel is a 24 y.o. G3P2002 at [redacted]w[redacted]d being seen today for ongoing prenatal care.  She is currently monitored for the following issues for this low-risk pregnancy and has Supervision of low-risk pregnancy; Anemia; Moderate major depression (HCC); GAD (generalized anxiety disorder); Carrier of genetic disorder; and Limited prenatal care in third trimester on their problem list.  Patient reports no complaints.  Contractions: Not present. Vag. Bleeding: None.  Movement: Present. Denies leaking of fluid.   The following portions of the patient's history were reviewed and updated as appropriate: allergies, current medications, past family history, past medical history, past social history, past surgical history and problem list.   Objective:   Vitals:   09/27/19 1429  BP: 128/85  Pulse: 96  Weight: 140 lb (63.5 kg)    Fetal Status: Fetal Heart Rate (bpm): 144 Fundal Height: 35 cm Movement: Present     General:  Alert, oriented and cooperative. Patient is in no acute distress.  Skin: Skin is warm and dry. No rash noted.   Cardiovascular: Normal heart rate noted  Respiratory: Normal respiratory effort, no problems with respiration noted  Abdomen: Soft, gravid, appropriate for gestational age.  Pain/Pressure: Present     Pelvic: Cervical exam performed Dilation: 1 Effacement (%): Thick Station: Ballotable  Extremities: Normal range of motion.  Edema: None  Mental Status: Normal mood and affect. Normal behavior. Normal judgment and thought content.   Assessment and Plan:  Pregnancy: G3P2002 at [redacted]w[redacted]d 1. Encounter for supervision of low-risk pregnancy in second trimester  - Glucose tolerance, 1 hour - CBC - HIV Antibody (routine testing w rflx) - RPR - Tdap vaccine greater than or equal to 7yo IM - Culture, beta strep (group b only) - GC/Chlamydia probe amp ([place])not at [facility] - [place] MFM OB FOLLOW UP; Future  Term labor symptoms and  general obstetric precautions including but not limited to vaginal bleeding, contractions, leaking of fluid and fetal movement were reviewed in detail with the patient. Please refer to After Visit Summary for other counseling recommendations.   No follow-ups on file.  No future appointments.  Venia Carbon, NP

## 2019-09-27 NOTE — Patient Instructions (Signed)
AREA PEDIATRIC/FAMILY PRACTICE PHYSICIANS  [place] ([address]) . [facility] o Chambliss, MD; Eniola, MD; Hale, MD; Hensel, MD; McDiarmid, MD; McIntyer, MD; Neal, MD; Walden, MD o [address] [place], [place] [address] o [phone] o Mon-Fri 8:30-12:30, 1:30-5:00 o Providers come to see babies at [facility] o Accepting Medicaid . [facility] o Limited providers who accept newborns: Koirala, MD; Morrow, MD; Wolters, MD o [address], [place], [place] [address] o [phone] o Mon-Fri 8:00-5:30 o Babies seen by providers at [facility] o Does NOT accept Medicaid o Please call early in hospitalization for appointment (limited availability)  . [facility] o Mulberry, MD o [address] [place], [place] [address] o [phone] o Mon, Tue, Thur, Fri 8:30-5:00, Wed 10:00-7:00 (closed 1-2pm) o Babies seen by [facility] providers o Accepting Medicaid . [facility] - Pediatrician o Rubin, MD o [address], [place], [place] [address] o [phone] o Mon-Fri 8:30-5:00, Sat 8:30-12:00 o Provider comes to see babies at [facility] o Accepting Medicaid o Must have been referred from current patients or contacted office prior to delivery . [facility] ([facility]) o Brown, MD; Chandler, MD; Ettefagh, MD; Grant, MD; Lester, MD; McCormick, MD; McQueen, MD; Prose, MD; Simha, MD; Stanley, MD; Stryffeler, NP; Tebben, NP o [address], [place], [place] [address] o [phone] o Mon, Tue, Thur, Fri 8:30-5:30, Wed 9:30-5:30, Sat 8:30-12:30 o Babies seen by [facility] providers o Accepting Medicaid o Only accepting infants of first-time parents or siblings of current patients o Hospital discharge coordinator will make follow-up appointment . Jack Amos o [address],  [place], [place]  [address] o [phone]   Fax - [phone] . [facility] o [address], [place], [place]  [address] o Phone - [phone]   Fax - [phone] . Shilpa Gosrani o [address], [place], [place]  [address] o [phone]  [place] ([address]) . [facility] o Bates, MD; Brassfield, MD; Cooper, Cox, MD; MD; Davis, MD; Dovico, MD; Ettefaugh, MD; Little, MD; Lowe, MD; Keiffer, MD; Melvin, MD; Sumner, MD; Williams, MD o [address], [place], [place] [address] o [phone] o Mon-Fri 8:30-5:00 (extended evenings Mon-Thur as needed), Sat-Sun 10:00-1:00 o Providers come to see babies at [facility] o Accepting Medicaid for families of first-time babies and families with all children in the household age 3 and under. Must register with office prior to making appointment (M-F only). . [facility] o Henson, NP; Knapp, MD; Lalonde, MD; Tysinger, PA o [address] [place], [place] [address] o [phone] o Mon-Fri 8:00-5:00 o Babies seen by providers at [facility] o Does NOT accept Medicaid/Commercial Insurance Only . [facility] ([facility])  o Artis, MD; Barnes, MD; Bratton, MD; Coccaro, MD; Lockett Gardner, MD; Kramer, MD; Marshall, MD; Netherton, MD; Poleto, MD; Skinner, MD o [address] [place], [place] [address] o [phone] o Mon-Fri 8:30-5:30, Sat (Oct.-Mar.) 9:00-1:00 o Babies seen by providers at [facility] o Accepting Medicaid  [place] ([address]) . [facility] o Reid, MD; Warner, MD o [address], , [place] [address] o [phone] o Mon-Fri 8:30-5:00, Sat 8:30-12:00 o Providers come to see babies at [facility] o Does NOT accept Medicaid . [facility] o Becker, PA; Hagler, MD; Scifres, PA; Sun, MD; Swayne, MD o [address],  [place], [place] [address] o [phone] o Mon-Fri 8:00-5:00 o Babies seen by providers at [facility] o Does NOT accept Medicaid o Only accepting babies of parents who are patients o Please call early in hospitalization for appointment (limited availability) . [facility] o Clark, MD; Frye, MD; Kelleher, MD; Mack, NP; Miller, MD; O'Keller, MD; Patterson, NP; Pudlo, MD; Puzio, MD; Thomas, MD; Tucker, MD; Twiselton, MD o [address], [place], [place] [address] o [phone] o Mon-Fri 8:00-5:00, Sat 9:00-12:00 o Providers come to see babies at [facility] o Does NOT accept Medicaid  [place] ([address]) . [facility] o Limited providers accepting new patients: Brake, NP; Wharton, PA o [address], [place], [place] [address] o [phone] o Mon-Fri 8:00-5:00 o Babies seen by providers at [facility] o Does NOT accept Medicaid o Only accepting babies of parents who are patients o Please call early in hospitalization for appointment (limited availability) . [facility] o Gay, MD; Quinlan, MD o [address] [place], [place] [address] o [phone] (press 1 to schedule appointment) o Mon-Fri 8:00-5:00 o Providers come to see babies at [facility] o Does NOT accept Medicaid . [facility] o Mazer, MD o [address] [place], [place] [address] o [phone] o Mon-Fri 8:30-5:00 (lunch 12:30-1:00), extended hours by appointment only Wed 5:00-6:30 o Babies seen by [facility] providers o Accepting Medicaid . [facility] o Banks, MD; Jordan, MD; Koberlein, MD o [address], [place], [place] [address] o [phone] o Mon-Fri 8:00-5:00 o Babies seen by [facility] providers o Does NOT accept Medicaid . [facility] o Parker, MD; Hunter, MD; Wallace, DO o [address] [place], [place]  [address] o [phone] o Mon-Fri 8:00-5:00 o Babies seen by [facility] providers o Does NOT accept Medicaid . [facility] o Brandon, PA; Brecken, PA; Christy, NP; Dees, MD; DeClaire, MD; DeWeese, MD; Hansen, NP; Mills, NP; Parrish, NP; Smoot, NP; Summer, MD; Vapne, MD o [address] [place], [place] [address] o [phone] o Mon-Fri 8:30-5:00, Sat 10:00-1:00 o Providers come to see babies at [facility] o Does NOT accept Medicaid o Free prenatal information session Tuesdays at 4:45pm . [facility] o Bouska, MD; Gordon, PA; Jeffery, PA; Weber, PA o [address] [place] [address] o [phone] o Mon-Fri 7:30-5:30 o Babies seen by [facility] providers . [facility] o [address], [place], [place]  [address] o [phone]   Fax - [phone]  [place] ([address]) . [facility] o Reese, MD o [address] [place], [place] [address] o [phone] o Mon-Thur 8:00-6:00 o Providers come to see babies at [facility] o Accepting Medicaid . [facility] o Anderson, NP; Badger, MD; Beal, PA; Spencer, PA o [address] [place], [place] [address] o [phone] o Mon-Thur 7:30-7:30, Fri 7:30-4:30 o Babies seen by [facility] providers o Accepting Medicaid . [facility] o Agbuya, MD; Klett, NP; Romgoolam, MD o [address], [place], [place] [address] o [phone] o Mon-Fri 8:30-5:00, Sat 8:30-12:00 o Providers come to see babies at [facility] o Accepting Medicaid o Must have "Meet & Greet" appointment at office prior to delivery . [facility] ([facility] o McCord,   MD; Wallace, MD; Wood, MD o [address], [place], [place] [address] o [phone] o Mon-Wed 8:00-6:00, Thur-Fri 8:00-5:00, Sat 9:00-12:00 o Providers come to  see babies at [facility] o Does NOT accept Medicaid o Only accepting siblings of current patients . [facility]  o [address], [place], [place]  [address] o [phone]   Fax - [phone] . [facility] o [address], [place], [place]  [address] o [phone]   Fax - [phone]  [place] ([address]) . [facility] o Cirigliano, DO; Matthews, DO o [address] [place], [place] [address] o [phone] o Mon-Fri 7:00-5:00 o Babies seen by [facility] providers o Does NOT accept Medicaid . [facility] o Briscoe, MD; Howley, PA; Moreira, PA o [address], [place], [place] [address] o [phone] o Mon-Fri 8:00-5:00 o Babies seen by [facility] providers o Accepting Medicaid . [facility] o Boyd, MD; Church, PA; Jones, NP; Osborn, PA o [address], [place], [place] [address] o [phone] o Mon-Fri 8:00-5:00 o Babies seen by providers at [facility] o Accepting Medicaid  [place] ([address]) . [facility] [place] o Wendling, DO o [address] [place], [place] [address] o [phone] o Mon-Fri 8:00-5:00 o Babies seen by [facility] providers o Does NOT accept Medicaid o Limited availability, please call early in hospitalization to schedule follow-up . [facility] o Calderon, PA; Cummings, MD; Dillard, MD; Martin, PA; Olson, MD; VanDeven, PA o [address], [place], [place] [address] o [phone] o Mon-Fri 8:30-5:00, Sat 9:00-12:00 o Babies seen by providers at [facility] o Accepting Medicaid o Please register online then schedule online or call office o [email or web address] . [facility] ([facility] o Hunter, NP; Kumar, MD; Martin Rogers, PA o [address], [place], [place] [address] o [phone] o Mon-Fri 8:00-5:00 o Babies seen by providers at [facility] o Accepting Medicaid . [facility] ([facility] o [place], MD; Kristi Fleenor, NP; West, MD o [address], [place], [place] [address] o [phone] o Mon-Fri 8:00-5:30, Sat&Sun by appointment (phones open at 8:30) o Babies seen by [facility] providers o Accepting Medicaid o Must be a first-time baby or sibling of current patient . [facility] [place]  o [address], [place], [place]  [address] o [phone]   Fax - [phone]  [place] ([address]) . [facility] o Brown, PA; Cowen, PA; Rice, MD; Helton, PA; Spry, MD o [address] [place], [place] [address] o [phone] o Mon-Thur 8:00-7:00, Fri 8:00-5:00, Sat 8:00-12:00, Sun 9:00-12:00 o Babies seen by [facility] providers o Accepting Medicaid . [facility] o Coe-Goins, MD; Marshall, MD; Pierre-Louis, MD o [address] [place] [address], [place], [place] [address] o [phone] o Mon-Thur 8:00-5:00 o Babies seen by providers at [facility] o Accepting Medicaid . [facility] [place] o Bratton, MD; Coe-Goins, MD; Hayes, MD; Lewis, MD; List, MD; Lott, MD; Marshall, MD; Moran, MD; O'Neal, MD; Pierre-Louis, MD; Pitonzo, MD; Scholer, MD; Spangle, MD o [address] [place] [address] [place], [place]   [address] o [phone] o Mon-Fri 8:00-5:30, Sat (Oct.-Mar.) 9:00-1:00 o Babies seen by providers at [facility] o Accepting Medicaid o Must fill out new patient packet, available online at [email or web address] . [facility] ([facility]) o Friddle, NP; Harris, NP; Kelly, NP; Logan, MD;  Melvin, PA; Poth, MD; Ramadoss, MD; Stanton, NP o [address], [place], [place] [address] o [phone] o Mon-Thur 8:00-5:30, Fri 8:00-5:00 o Babies seen by providers at [facility] o Accepting Medicaid  [place] ([address]) . [facility] o Dixon, PA; , MD; Pickard, MD; Tapia, PA o [address], [place], [place] [address] o [phone] o Mon-Fri 8:00-5:00 o Babies seen by providers at [facility] o Accepting Medicaid   [place] ([address]) . [facility] [place] o Masneri, DO; Meyers, MD; Nelson, PA o [address], [place], [place] [address] o [phone] o Mon-Fri 8:00-5:00 o Babies seen by providers at [facility] o Does NOT accept Medicaid o Limited appointment availability, please call early in hospitalization  . [facility] [place] o Kunedd, DO; McGowen, MD o [address], [place], [place] [address] o [phone] o Mon-Fri 8:00-5:00 o Babies seen by [facility] providers o Does NOT accept Medicaid . [facility] [place] o Cameron, MD; MacDonald, MD; Michaels, PA; Nayak, MD o [address] [place] [address], [place], [place] [address] o [phone] o Mon-Fri 8:00-5:00 o After hours clinic ([address] [place], [place] [address]) [phone] Mon-Fri 5:00-8:00, Sat 12:00-6:00, Sun 10:00-4:00 o Babies seen by [facility] providers o Accepting Medicaid . [facility] [place] o [address], [place], [place]  [address] o [phone]   Fax - [phone]  [place] ([address]) . [facility] [place] [facility] o Andy, MD o [address], [place], [place] [address] o [phone] o Mon-Fri 8:00-5:00 o Babies seen by [facility] providers o Does NOT accept Medicaid . [facility] [place] ([facility] [place]) o Eksir, MD o [address], [place], [place]  [address] o [phone] o Mon-Thur 8:00-7:00, Fri 8:00-5:00, Sat 8:00-12:00 o Babies seen by providers at [facility] o Accepting Medicaid - but does not have vaccinations in office (must be received elsewhere) o Limited availability, please call early in hospitalization  [place] ([address]) . [facility]  o Charlene Flemming, MD o [address],  [place] [address] o [phone]  Fax [phone]   

## 2019-09-28 LAB — GC/CHLAMYDIA PROBE AMP (~~LOC~~) NOT AT ARMC
Chlamydia: NEGATIVE
Comment: NEGATIVE
Comment: NORMAL
Neisseria Gonorrhea: NEGATIVE

## 2019-09-28 LAB — RPR: RPR Ser Ql: NONREACTIVE

## 2019-09-28 LAB — CBC
Hematocrit: 30.6 % — ABNORMAL LOW (ref 34.0–46.6)
Hemoglobin: 10.2 g/dL — ABNORMAL LOW (ref 11.1–15.9)
MCH: 28.9 pg (ref 26.6–33.0)
MCHC: 33.3 g/dL (ref 31.5–35.7)
MCV: 87 fL (ref 79–97)
Platelets: 171 10*3/uL (ref 150–450)
RBC: 3.53 x10E6/uL — ABNORMAL LOW (ref 3.77–5.28)
RDW: 14.1 % (ref 11.7–15.4)
WBC: 7.9 10*3/uL (ref 3.4–10.8)

## 2019-09-28 LAB — GLUCOSE TOLERANCE, 1 HOUR: Glucose, 1Hr PP: 101 mg/dL (ref 65–199)

## 2019-09-28 LAB — HIV ANTIBODY (ROUTINE TESTING W REFLEX): HIV Screen 4th Generation wRfx: NONREACTIVE

## 2019-10-01 ENCOUNTER — Ambulatory Visit (HOSPITAL_COMMUNITY)
Admission: RE | Admit: 2019-10-01 | Discharge: 2019-10-01 | Disposition: A | Discharge status: Home / Self Care | Payer: Medicaid Other | Source: Ambulatory Visit | Attending: Obstetrics and Gynecology | Admitting: Obstetrics and Gynecology

## 2019-10-01 ENCOUNTER — Other Ambulatory Visit: Payer: Self-pay

## 2019-10-01 DIAGNOSIS — Z362 Encounter for other antenatal screening follow-up: Secondary | ICD-10-CM | POA: Diagnosis not present

## 2019-10-01 DIAGNOSIS — Z3A37 37 weeks gestation of pregnancy: Secondary | ICD-10-CM

## 2019-10-01 DIAGNOSIS — Z3492 Encounter for supervision of normal pregnancy, unspecified, second trimester: Secondary | ICD-10-CM | POA: Insufficient documentation

## 2019-10-01 LAB — CULTURE, BETA STREP (GROUP B ONLY): Strep Gp B Culture: NEGATIVE

## 2019-10-08 ENCOUNTER — Encounter: Payer: Self-pay | Admitting: Medical

## 2019-10-08 ENCOUNTER — Other Ambulatory Visit: Payer: Self-pay

## 2019-10-08 ENCOUNTER — Ambulatory Visit (INDEPENDENT_AMBULATORY_CARE_PROVIDER_SITE_OTHER): Payer: Medicaid Other | Admitting: Medical

## 2019-10-08 VITALS — BP 128/79 | HR 87 | Wt 141.9 lb

## 2019-10-08 DIAGNOSIS — O0933 Supervision of pregnancy with insufficient antenatal care, third trimester: Secondary | ICD-10-CM

## 2019-10-08 DIAGNOSIS — O99013 Anemia complicating pregnancy, third trimester: Secondary | ICD-10-CM

## 2019-10-08 DIAGNOSIS — Z148 Genetic carrier of other disease: Secondary | ICD-10-CM

## 2019-10-08 DIAGNOSIS — Z3A38 38 weeks gestation of pregnancy: Secondary | ICD-10-CM

## 2019-10-08 DIAGNOSIS — Z3493 Encounter for supervision of normal pregnancy, unspecified, third trimester: Secondary | ICD-10-CM

## 2019-10-08 DIAGNOSIS — D649 Anemia, unspecified: Secondary | ICD-10-CM

## 2019-10-08 DIAGNOSIS — R1011 Right upper quadrant pain: Secondary | ICD-10-CM

## 2019-10-08 LAB — POCT URINALYSIS DIP (DEVICE)
Bilirubin Urine: NEGATIVE
Glucose, UA: NEGATIVE mg/dL
Hgb urine dipstick: NEGATIVE
Ketones, ur: NEGATIVE mg/dL
Leukocytes,Ua: NEGATIVE
Nitrite: NEGATIVE
Protein, ur: NEGATIVE mg/dL
Specific Gravity, Urine: 1.025 (ref 1.005–1.030)
Urobilinogen, UA: 1 mg/dL (ref 0.0–1.0)
pH: 7 (ref 5.0–8.0)

## 2019-10-08 NOTE — Progress Notes (Signed)
   PRENATAL VISIT NOTE  Subjective:  Brooke Patel is a 24 y.o. G3P2002 at 22w4dbeing seen today for ongoing prenatal care.  She is currently monitored for the following issues for this low-risk pregnancy and has Supervision of low-risk pregnancy; Anemia; Moderate major depression ([place]; GAD (generalized anxiety disorder); Carrier of genetic disorder; and Limited prenatal care in third trimester on their problem list.  Patient reports occasional contractions.  Contractions: Not present. Vag. Bleeding: None.  Movement: Present. Denies leaking of fluid.   The following portions of the patient's history were reviewed and updated as appropriate: allergies, current medications, past family history, past medical history, past social history, past surgical history and problem list.   Objective:   Vitals:   10/08/19 1459  BP: 128/79  Pulse: 87  Weight: 141 lb 14.4 oz (64.4 kg)    Fetal Status: Fetal Heart Rate (bpm): 144 Fundal Height: 36 cm Movement: Present  Presentation: Vertex  General:  Alert, oriented and cooperative. Patient is in no acute distress.  Skin: Skin is warm and dry. No rash noted.   Cardiovascular: Normal heart rate noted  Respiratory: Normal respiratory effort, no problems with respiration noted  Abdomen: Soft, gravid, appropriate for gestational age.  Pain/Pressure: Present     Pelvic: Cervical exam performed Dilation: 1 Effacement (%): Thick Station: Ballotable  Extremities: Normal range of motion.  Edema: None  Mental Status: Normal mood and affect. Normal behavior. Normal judgment and thought content.   Assessment and Plan:  Pregnancy: G3P2002 at 371w4d. Encounter for supervision of low-risk pregnancy in third trimester - Doing well, mild intermittent cramping  - GBS negative at previous visit  - Still unsure of M[place]- Deciding between breast, bottle or both  - All questions answered   2. Carrier of genetic disorder  3. Limited prenatal care in third  trimester  4. Anemia, unspecified type - Last CBC 3/4 Hgb 10.2  5. RUQ pain - Intermittent, burning pain, often postprandial  - Comp Met (CMET) - Lipase - Wraps around to flank, UA today - normal  Term labor symptoms and general obstetric precautions including but not limited to vaginal bleeding, contractions, leaking of fluid and fetal movement were reviewed in detail with the patient. Please refer to After Visit Summary for other counseling recommendations.   Return in about 1 week (around 10/15/2019) for LOB, In-Person.  Future Appointments  Date Time Provider DeImogene3/22/2021  3:15 PM LaJorje GuildNP [facility]    JuKerry HoughPA-C

## 2019-10-08 NOTE — Patient Instructions (Signed)
Fetal Movement Counts Patient Name: ________________________________________________ Patient Due Date: ____________________ What is a fetal movement count?  A fetal movement count is the number of times that you feel your baby move during a certain amount of time. This may also be called a fetal kick count. A fetal movement count is recommended for every pregnant woman. You may be asked to start counting fetal movements as early as week 28 of your pregnancy. Pay attention to when your baby is most active. You may notice your baby's sleep and wake cycles. You may also notice things that make your baby move more. You should do a fetal movement count:  When your baby is normally most active.  At the same time each day. A good time to count movements is while you are resting, after having something to eat and drink. How do I count fetal movements? 1. Find a quiet, comfortable area. Sit, or lie down on your side. 2. Write down the date, the start time and stop time, and the number of movements that you felt between those two times. Take this information with you to your health care visits. 3. Write down your start time when you feel the first movement. 4. Count kicks, flutters, swishes, rolls, and jabs. You should feel at least 10 movements. 5. You may stop counting after you have felt 10 movements, or if you have been counting for 2 hours. Write down the stop time. 6. If you do not feel 10 movements in 2 hours, contact your health care provider for further instructions. Your health care provider may want to do additional tests to assess your baby's well-being. Contact a health care provider if:  You feel fewer than 10 movements in 2 hours.  Your baby is not moving like he or she usually does. Date: ____________ Start time: ____________ Stop time: ____________ Movements: ____________ Date: ____________ Start time: ____________ Stop time: ____________ Movements: ____________ Date: ____________  Start time: ____________ Stop time: ____________ Movements: ____________ Date: ____________ Start time: ____________ Stop time: ____________ Movements: ____________ Date: ____________ Start time: ____________ Stop time: ____________ Movements: ____________ Date: ____________ Start time: ____________ Stop time: ____________ Movements: ____________ Date: ____________ Start time: ____________ Stop time: ____________ Movements: ____________ Date: ____________ Start time: ____________ Stop time: ____________ Movements: ____________ Date: ____________ Start time: ____________ Stop time: ____________ Movements: ____________ This information is not intended to replace advice given to you by your health care provider. Make sure you discuss any questions you have with your health care provider. Document Revised: 03/01/2019 Document Reviewed: 03/01/2019 Elsevier Patient Education  2020 [employer] Inc. Braxton Hicks Contractions Contractions of the uterus can occur throughout pregnancy, but they are not always a sign that you are in labor. You may have practice contractions called Braxton Hicks contractions. These false labor contractions are sometimes confused with true labor. What are Braxton Hicks contractions? Braxton Hicks contractions are tightening movements that occur in the muscles of the uterus before labor. Unlike true labor contractions, these contractions do not result in opening (dilation) and thinning of the cervix. Toward the end of pregnancy (32-34 weeks), Braxton Hicks contractions can happen more often and may become stronger. These contractions are sometimes difficult to tell apart from true labor because they can be very uncomfortable. You should not feel embarrassed if you go to the hospital with false labor. Sometimes, the only way to tell if you are in true labor is for your health care provider to look for changes in the cervix. The health care provider   will do a physical exam and may  monitor your contractions. If you are not in true labor, the exam should show that your cervix is not dilating and your water has not broken. If there are no other health problems associated with your pregnancy, it is completely safe for you to be sent home with false labor. You may continue to have Braxton Hicks contractions until you go into true labor. How to tell the difference between true labor and false labor True labor  Contractions last 30-70 seconds.  Contractions become very regular.  Discomfort is usually felt in the top of the uterus, and it spreads to the lower abdomen and low back.  Contractions do not go away with walking.  Contractions usually become more intense and increase in frequency.  The cervix dilates and gets thinner. False labor  Contractions are usually shorter and not as strong as true labor contractions.  Contractions are usually irregular.  Contractions are often felt in the front of the lower abdomen and in the groin.  Contractions may go away when you walk around or change positions while lying down.  Contractions get weaker and are shorter-lasting as time goes on.  The cervix usually does not dilate or become thin. Follow these instructions at home:   Take over-the-counter and prescription medicines only as told by your health care provider.  Keep up with your usual exercises and follow other instructions from your health care provider.  Eat and drink lightly if you think you are going into labor.  If Braxton Hicks contractions are making you uncomfortable: ? Change your position from lying down or resting to walking, or change from walking to resting. ? Sit and rest in a tub of warm water. ? Drink enough fluid to keep your urine pale yellow. Dehydration may cause these contractions. ? Do slow and deep breathing several times an hour.  Keep all follow-up prenatal visits as told by your health care provider. This is important. Contact a  health care provider if:  You have a fever.  You have continuous pain in your abdomen. Get help right away if:  Your contractions become stronger, more regular, and closer together.  You have fluid leaking or gushing from your vagina.  You pass blood-tinged mucus (bloody show).  You have bleeding from your vagina.  You have low back pain that you never had before.  You feel your baby's head pushing down and causing pelvic pressure.  Your baby is not moving inside you as much as it used to. Summary  Contractions that occur before labor are called Braxton Hicks contractions, false labor, or practice contractions.  Braxton Hicks contractions are usually shorter, weaker, farther apart, and less regular than true labor contractions. True labor contractions usually become progressively stronger and regular, and they become more frequent.  Manage discomfort from Braxton Hicks contractions by changing position, resting in a warm bath, drinking plenty of water, or practicing deep breathing. This information is not intended to replace advice given to you by your health care provider. Make sure you discuss any questions you have with your health care provider. Document Revised: 06/24/2017 Document Reviewed: 11/25/2016 Elsevier Patient Education  2020 Elsevier Inc.  

## 2019-10-09 LAB — COMPREHENSIVE METABOLIC PANEL
ALT: 13 IU/L (ref 0–32)
AST: 25 IU/L (ref 0–40)
Albumin/Globulin Ratio: 1.4 (ref 1.2–2.2)
Albumin: 3.9 g/dL (ref 3.9–5.0)
Alkaline Phosphatase: 128 IU/L — ABNORMAL HIGH (ref 39–117)
BUN/Creatinine Ratio: 16 (ref 9–23)
BUN: 11 mg/dL (ref 6–20)
Bilirubin Total: <0.2 mg/dL (ref 0.0–1.2)
CO2: 23 mmol/L (ref 20–29)
Calcium: 9.1 mg/dL (ref 8.7–10.2)
Chloride: 102 mmol/L (ref 96–106)
Creatinine, Ser: 0.67 mg/dL (ref 0.57–1.00)
GFR calc Af Amer: 141 mL/min/{1.73_m2} (ref >59)
GFR calc non Af Amer: 123 mL/min/{1.73_m2} (ref >59)
Globulin, Total: 2.7 g/dL (ref 1.5–4.5)
Glucose: 74 mg/dL (ref 65–99)
Potassium: 4 mmol/L (ref 3.5–5.2)
Sodium: 137 mmol/L (ref 134–144)
Total Protein: 6.6 g/dL (ref 6.0–8.5)

## 2019-10-09 LAB — LIPASE: Lipase: 32 U/L (ref 14–72)

## 2019-10-15 ENCOUNTER — Encounter: Payer: Self-pay | Admitting: Student

## 2019-10-15 ENCOUNTER — Ambulatory Visit (INDEPENDENT_AMBULATORY_CARE_PROVIDER_SITE_OTHER): Payer: Medicaid Other | Admitting: Student

## 2019-10-15 ENCOUNTER — Other Ambulatory Visit: Payer: Self-pay

## 2019-10-15 VITALS — BP 127/86 | HR 74 | Wt 142.2 lb

## 2019-10-15 DIAGNOSIS — Z3493 Encounter for supervision of normal pregnancy, unspecified, third trimester: Secondary | ICD-10-CM

## 2019-10-15 NOTE — Patient Instructions (Signed)
The Maternity Assessment Unit (MAU) is located at the [facility] [place] at [facility]. The address is: [address], [place], [place], [place] [address]. Please see map below for additional directions.    The Maternity Assessment Unit is designed to help you during your pregnancy, and for up to 6 weeks after delivery, with any pregnancy- or postpartum-related emergencies, if you think you are in labor, or if your water has broken. For example, if you experience nausea and vomiting, vaginal bleeding, severe abdominal or pelvic pain, elevated blood pressure or other problems related to your pregnancy or postpartum time, please come to the Maternity Assessment Unit for assistance.     Signs and Symptoms of Labor Labor is your body's natural process of moving your baby, placenta, and umbilical cord out of your uterus. The process of labor usually starts when your baby is full-term, between 88 and 40 weeks of pregnancy. How will I know when I am close to going into labor? As your body prepares for labor and the birth of your baby, you may notice the following symptoms in the weeks and days before true labor starts:  Having a strong desire to get your home ready to receive your new baby. This is called nesting. Nesting may be a sign that labor is approaching, and it may occur several weeks before birth. Nesting may involve cleaning and organizing your home.  Passing a small amount of thick, bloody mucus out of your vagina (normal bloody show or losing your mucus plug). This may happen more than a week before labor begins, or it might occur right before labor begins as the opening of the cervix starts to widen (dilate). For some women, the entire mucus plug passes at once. For others, smaller portions of the mucus plug may gradually pass over several days.  Your baby moving (dropping) lower in your pelvis to get into position for birth (lightening). When this happens, you may  feel more pressure on your bladder and pelvic bone and less pressure on your ribs. This may make it easier to breathe. It may also cause you to need to urinate more often and have problems with bowel movements.  Having "practice contractions" (Braxton Hicks contractions) that occur at irregular (unevenly spaced) intervals that are more than 10 minutes apart. This is also called false labor. False labor contractions are common after exercise or sexual activity, and they will stop if you change position, rest, or drink fluids. These contractions are usually mild and do not get stronger over time. They may feel like: ? A backache or back pain. ? Mild cramps, similar to menstrual cramps. ? Tightening or pressure in your abdomen. Other early symptoms that labor may be starting soon include:  Nausea or loss of appetite.  Diarrhea.  Having a sudden burst of energy, or feeling very tired.  Mood changes.  Having trouble sleeping. How will I know when labor has begun? Signs that true labor has begun may include:  Having contractions that come at regular (evenly spaced) intervals and increase in intensity. This may feel like more intense tightening or pressure in your abdomen that moves to your back. ? Contractions may also feel like rhythmic pain in your upper thighs or back that comes and goes at regular intervals. ? For first-time mothers, this change in intensity of contractions often occurs at a more gradual pace. ? Women who have given birth before may notice a more rapid progression of contraction changes.  Having a feeling of pressure in the vaginal area.  Your water breaking (rupture of membranes). This is when the sac of fluid that surrounds your baby breaks. When this happens, you will notice fluid leaking from your vagina. This may be clear or blood-tinged. Labor usually starts within 24 hours of your water breaking, but it may take longer to begin. ? Some women notice this as a gush of  fluid. ? Others notice that their underwear repeatedly becomes damp. Follow these instructions at home:   When labor starts, or if your water breaks, call your health care provider or nurse care line. Based on your situation, they will determine when you should go in for an exam.  When you are in early labor, you may be able to rest and manage symptoms at home. Some strategies to try at home include: ? Breathing and relaxation techniques. ? Taking a warm bath or shower. ? Listening to music. ? Using a heating pad on the lower back for pain. If you are directed to use heat:  Place a towel between your skin and the heat source.  Leave the heat on for 20-30 minutes.  Remove the heat if your skin turns bright red. This is especially important if you are unable to feel pain, heat, or cold. You may have a greater risk of getting burned. Get help right away if:  You have painful, regular contractions that are 5 minutes apart or less.  Labor starts before you are [redacted] weeks along in your pregnancy.  You have a fever.  You have a headache that does not go away.  You have bright red blood coming from your vagina.  You do not feel your baby moving.  You have a sudden onset of: ? Severe headache with vision problems. ? Nausea, vomiting, or diarrhea. ? Chest pain or shortness of breath. These symptoms may be an emergency. If your health care provider recommends that you go to the hospital or birth center where you plan to deliver, do not drive yourself. Have someone else drive you, or call emergency services (911 in the [place] Summary  Labor is your body's natural process of moving your baby, placenta, and umbilical cord out of your uterus.  The process of labor usually starts when your baby is full-term, between 19 and 40 weeks of pregnancy.  When labor starts, or if your water breaks, call your health care provider or nurse care line. Based on your situation, they will determine when you  should go in for an exam. This information is not intended to replace advice given to you by your health care provider. Make sure you discuss any questions you have with your health care provider. Document Revised: 04/11/2017 Document Reviewed: 12/17/2016 Elsevier Patient Education  2020 [employer].

## 2019-10-15 NOTE — Progress Notes (Signed)
   PRENATAL VISIT NOTE  Subjective:  Brooke Patel is a 24 y.o. G3P2002 at [redacted]w[redacted]d being seen today for ongoing prenatal care.  She is currently monitored for the following issues for this low-risk pregnancy and has Supervision of low-risk pregnancy; Anemia; Moderate major depression (HCC); GAD (generalized anxiety disorder); Carrier of genetic disorder; and Limited prenatal care in third trimester on their problem list.  Patient reports occasional contractions.  Contractions: Not present. Vag. Bleeding: None.  Movement: Present. Denies leaking of fluid.   The following portions of the patient's history were reviewed and updated as appropriate: allergies, current medications, past family history, past medical history, past social history, past surgical history and problem list.   Objective:   Vitals:   10/15/19 1525  BP: 127/86  Pulse: 74  Weight: 142 lb 3.2 oz (64.5 kg)    Fetal Status: Fetal Heart Rate (bpm): 138 Fundal Height: 37 cm Movement: Present  Presentation: Vertex  General:  Alert, oriented and cooperative. Patient is in no acute distress.  Skin: Skin is warm and dry. No rash noted.   Cardiovascular: Normal heart rate noted  Respiratory: Normal respiratory effort, no problems with respiration noted  Abdomen: Soft, gravid, appropriate for gestational age.  Pain/Pressure: Present     Pelvic: Cervical exam performed in the presence of a chaperone Dilation: 1 Effacement (%): 50 Station: -3  Extremities: Normal range of motion.  Edema: None  Mental Status: Normal mood and affect. Normal behavior. Normal judgment and thought content.   Assessment and Plan:  Pregnancy: G3P2002 at [redacted]w[redacted]d 1. Encounter for supervision of low-risk pregnancy in third trimester -doing well -GBS negative -scheduled for 41 wk induction. Will come back next week for BPP/NST & provider visit  Term labor symptoms and general obstetric precautions including but not limited to vaginal bleeding,  contractions, leaking of fluid and fetal movement were reviewed in detail with the patient. Please refer to After Visit Summary for other counseling recommendations.   Return in about 1 week (around 10/22/2019) for in person for ROB & BPP/NST.  Future Appointments  Date Time Provider Department Center  10/24/2019  1:15 PM [facility] NST [facility]  10/24/2019  2:15 PM Reva Bores, MD [facility]  10/25/2019 12:00 AM [facility] SCHED ROOM [facility] None    Judeth Horn, NP

## 2019-10-16 ENCOUNTER — Telehealth (HOSPITAL_COMMUNITY): Payer: Self-pay | Admitting: *Deleted

## 2019-10-16 ENCOUNTER — Encounter (HOSPITAL_COMMUNITY): Payer: Self-pay | Admitting: *Deleted

## 2019-10-16 NOTE — Telephone Encounter (Signed)
Preadmission screen  

## 2019-10-17 ENCOUNTER — Other Ambulatory Visit: Payer: Self-pay | Admitting: Advanced Practice Midwife

## 2019-10-17 ENCOUNTER — Other Ambulatory Visit: Payer: Self-pay | Admitting: Family Medicine

## 2019-10-17 NOTE — Progress Notes (Signed)
Message sent to scheduling provider for induction orders  Marlowe Alt, DO OB Fellow, [facility] 10/17/2019 11:48 AM

## 2019-10-23 ENCOUNTER — Encounter (HOSPITAL_COMMUNITY): Payer: Self-pay | Admitting: Family Medicine

## 2019-10-23 ENCOUNTER — Other Ambulatory Visit (HOSPITAL_COMMUNITY): Admission: RE | Admit: 2019-10-23 | Payer: Medicaid Other | Source: Ambulatory Visit

## 2019-10-23 ENCOUNTER — Other Ambulatory Visit: Payer: Self-pay

## 2019-10-23 ENCOUNTER — Inpatient Hospital Stay (HOSPITAL_COMMUNITY)
Admission: AD | Admit: 2019-10-23 | Discharge: 2019-10-25 | DRG: 806 | Disposition: A | Discharge status: Home / Self Care | Payer: Medicaid Other | Attending: Family Medicine | Admitting: Family Medicine

## 2019-10-23 DIAGNOSIS — Z3493 Encounter for supervision of normal pregnancy, unspecified, third trimester: Secondary | ICD-10-CM

## 2019-10-23 DIAGNOSIS — Z148 Genetic carrier of other disease: Secondary | ICD-10-CM

## 2019-10-23 DIAGNOSIS — Z3A40 40 weeks gestation of pregnancy: Secondary | ICD-10-CM

## 2019-10-23 DIAGNOSIS — O99344 Other mental disorders complicating childbirth: Secondary | ICD-10-CM | POA: Diagnosis present

## 2019-10-23 DIAGNOSIS — D649 Anemia, unspecified: Secondary | ICD-10-CM | POA: Diagnosis present

## 2019-10-23 DIAGNOSIS — O9902 Anemia complicating childbirth: Secondary | ICD-10-CM | POA: Diagnosis present

## 2019-10-23 DIAGNOSIS — O480 Post-term pregnancy: Secondary | ICD-10-CM | POA: Diagnosis not present

## 2019-10-23 DIAGNOSIS — F411 Generalized anxiety disorder: Secondary | ICD-10-CM | POA: Diagnosis present

## 2019-10-23 DIAGNOSIS — O26893 Other specified pregnancy related conditions, third trimester: Secondary | ICD-10-CM | POA: Diagnosis present

## 2019-10-23 DIAGNOSIS — O0933 Supervision of pregnancy with insufficient antenatal care, third trimester: Secondary | ICD-10-CM

## 2019-10-23 DIAGNOSIS — O164 Unspecified maternal hypertension, complicating childbirth: Secondary | ICD-10-CM | POA: Diagnosis present

## 2019-10-23 DIAGNOSIS — F321 Major depressive disorder, single episode, moderate: Secondary | ICD-10-CM | POA: Diagnosis present

## 2019-10-23 DIAGNOSIS — Z20822 Contact with and (suspected) exposure to covid-19: Secondary | ICD-10-CM | POA: Diagnosis present

## 2019-10-23 LAB — TYPE AND SCREEN
ABO/RH(D): A POS
Antibody Screen: NEGATIVE

## 2019-10-23 LAB — CBC
HCT: 35.3 % — ABNORMAL LOW (ref 36.0–46.0)
Hemoglobin: 11.8 g/dL — ABNORMAL LOW (ref 12.0–15.0)
MCH: 29 pg (ref 26.0–34.0)
MCHC: 33.4 g/dL (ref 30.0–36.0)
MCV: 86.7 fL (ref 80.0–100.0)
Platelets: 199 10*3/uL (ref 150–400)
RBC: 4.07 MIL/uL (ref 3.87–5.11)
RDW: 15 % (ref 11.5–15.5)
WBC: 10.3 10*3/uL (ref 4.0–10.5)
nRBC: 0 % (ref 0.0–0.2)

## 2019-10-23 LAB — RESPIRATORY PANEL BY RT PCR (FLU A&B, COVID)
Influenza A by PCR: NEGATIVE
Influenza B by PCR: NEGATIVE
SARS Coronavirus 2 by RT PCR: NEGATIVE

## 2019-10-23 LAB — ABO/RH: ABO/RH(D): A POS

## 2019-10-23 MED ORDER — ACETAMINOPHEN 325 MG PO TABS: 650.0000 mg | ORAL_TABLET | ORAL | Status: DC | PRN

## 2019-10-23 MED ORDER — LACTATED RINGERS IV SOLN: INTRAVENOUS | Status: DC

## 2019-10-23 MED ORDER — OXYTOCIN BOLUS FROM INFUSION
500.0000 mL | Freq: Once | INTRAVENOUS | Status: AC
  Administered 2019-10-24: 500 mL via INTRAVENOUS

## 2019-10-23 MED ORDER — OXYTOCIN 40 UNITS IN NORMAL SALINE INFUSION - SIMPLE MED
2.5000 [IU]/h | INTRAVENOUS | Status: DC
  Filled 2019-10-23: qty 1000

## 2019-10-23 MED ORDER — FENTANYL CITRATE (PF) 100 MCG/2ML IJ SOLN
50.0000 ug | INTRAMUSCULAR | Status: DC | PRN
  Administered 2019-10-24 (×2): 100 ug via INTRAVENOUS
  Filled 2019-10-23 (×2): qty 2

## 2019-10-23 MED ORDER — TERBUTALINE SULFATE 1 MG/ML IJ SOLN: 0.2500 mg | Freq: Once | INTRAMUSCULAR | Status: DC | PRN

## 2019-10-23 MED ORDER — SOD CITRATE-CITRIC ACID 500-334 MG/5ML PO SOLN: 30.0000 mL | ORAL | Status: DC | PRN

## 2019-10-23 MED ORDER — LIDOCAINE HCL (PF) 1 % IJ SOLN: 30.0000 mL | INTRAMUSCULAR | Status: DC | PRN

## 2019-10-23 MED ORDER — LACTATED RINGERS IV SOLN: 500.0000 mL | INTRAVENOUS | Status: DC | PRN

## 2019-10-23 MED ORDER — OXYTOCIN 40 UNITS IN NORMAL SALINE INFUSION - SIMPLE MED
1.0000 m[IU]/min | INTRAVENOUS | Status: DC
  Administered 2019-10-23: 2 m[IU]/min via INTRAVENOUS

## 2019-10-23 MED ORDER — ONDANSETRON HCL 4 MG/2ML IJ SOLN: 4.0000 mg | Freq: Four times a day (QID) | INTRAMUSCULAR | Status: DC | PRN

## 2019-10-23 NOTE — MAU Note (Signed)
Pt states she started having contractions around 0200 that have bene about 5 minutes apart. Upon arrival she states she noticed some brown discharge in her underwear. Denies LOF. Reports some fetal movement but not as much as usual since the contractions started. Denies complications with the pregnancy.

## 2019-10-23 NOTE — Progress Notes (Signed)
Brooke Patel is a 24 y.o. G3P2002 at [redacted]w[redacted]d admitted for latent labor at term  Subjective: Feeling contractions every 6-8 minutes  Objective: BP 121/90   Pulse 84   Temp 98.4 F (36.9 C) (Oral)   Resp 14   Ht 5\' 3"  (1.6 m)   Wt 64.4 kg   LMP 01/11/2019   SpO2 100%   BMI 25.15 kg/m  No intake/output data recorded.  FHT:  FHR: 145-150 bpm, variability: moderate,  accelerations:  Present,  decelerations:  Absent UC:   regular, every 6-8 minutes  SVE:   Dilation: 4 Effacement (%): 70 Station: -2 Exam by:: [email or web address], RN  Labs: Lab Results  Component Value Date   WBC 10.3 10/23/2019   HGB 11.8 (L) 10/23/2019   HCT 35.3 (L) 10/23/2019   MCV 86.7 10/23/2019   PLT 199 10/23/2019    Assessment / Plan: 24 yo G3P2002 at 40.5 EGA admitted for latent labor  Labor: No cervical change, will augment with pitocin. Consider AROM as needed. Fetal Wellbeing:  Category I Pain Control:  Per Patient request I/D:  GBS neg Anticipated MOD:  NSVD   L  DO OB Fellow, [facility] 10/23/2019, 11:50 PM

## 2019-10-23 NOTE — H&P (Signed)
OBSTETRIC ADMISSION HISTORY AND PHYSICAL  Brooke Patel is a 24 y.o. female G3P2002 with IUP at 47w5dpresenting for SOL with contractions starting at 0200. She reports +FMs. No LOF, VB, blurry vision, headaches, peripheral edema, or RUQ pain. She plans on breastfeeding. She is unsure of what she wants for birth control.  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 Anemia     Past Surgical History: Past Surgical History:  Procedure Laterality Date  . NO PAST SURGERIES      Obstetrical History: OB History    Gravida  3   Para  2   Term  2   Preterm      AB      Living  2     SAB      TAB      Ectopic      Multiple      Live Births  2           Social History: Social History   Socioeconomic History  . Marital status: Single    Spouse name: Not on file  . Number of children: Not on file  . Years of education: 178 . Highest education level: Not on file  Occupational History  . Not on file  Tobacco Use  . Smoking status: Never Smoker  . Smokeless tobacco: Never Used  Substance and Sexual Activity  . Alcohol use: No  . Drug use: No  . Sexual activity: Yes    Birth control/protection: None  Other Topics Concern  . Not on file  Social History Narrative  . Not on file   Social Determinants of Health   Financial Resource Strain:   . Difficulty of Paying Living Expenses:   Food Insecurity: No Food Insecurity  . Worried About RCharity fundraiserin the Last Year: Never true  . Ran Out of Food in the Last Year: Never true  Transportation Needs: No Transportation Needs  . Lack of Transportation (Medical): No  . Lack of Transportation (Non-Medical): No  Physical Activity:   . Days of Exercise per  Week:   . Minutes of Exercise per Session:   Stress:   . Feeling of Stress :   Social Connections:   . Frequency of Communication with Friends and Family:   . Frequency of Social Gatherings with Friends and Family:   . Attends Religious Services:   . Active Member of Clubs or Organizations:   . Attends CArchivistMeetings:   .Marland KitchenMarital Status:     Family History: Family History  Problem Relation Age of Onset  . Diabetes Paternal Grandmother   . Cancer Paternal Grandmother     Allergies: No Known Allergies  Medications Prior to Admission  Medication Sig Dispense Refill Last Dose  . acetaminophen (TYLENOL) 325 MG tablet Take 325-650 mg by mouth 2 (two) times daily as needed for moderate pain.     . Blood Pressure Monitoring (BLOOD PRESSURE KIT) DEVI 1 Device by Does not apply route as needed. ICD 10 Z34.90 1 Device 0   . Prenatal Vit-Fe Fumarate-FA (PRENATAL VITAMIN) 27-0.8 MG TABS Take by mouth.        Review of Systems:  All systems reviewed and negative except as stated in HPI  PE: Blood  pressure 135/86, pulse 83, temperature 98.2 F (36.8 C), temperature source Oral, resp. rate 15, last menstrual period 01/11/2019, SpO2 100 %. General appearance: alert and cooperative Lungs: regular rate and effort Heart: regular rate  Abdomen: soft, non-tender Extremities: Homans sign is negative, no sign of DVT Presentation: cephalic EFM: 697 bpm, moderate variability, 15x15 accels, no decels Toco: unable to adequately pick up contractions 3/70/-2  Prenatal labs: ABO, Rh: A/Positive/-- (09/28 1125) Antibody: Negative (09/28 1125) Rubella: 1.55 (09/28 1125) RPR: Non Reactive (03/04 1530)  HBsAg: Negative (09/28 1125)  HIV: Non Reactive (03/04 1530)  GBS: Negative/-- (03/04 1501)  2 hr GTT- normal  Prenatal Transfer Tool  Maternal Diabetes: No Genetic Screening: Normal Maternal Ultrasounds/Referrals: Normal Fetal Ultrasounds or other Referrals:  None Maternal  Substance Abuse:  No Significant Maternal Medications:  None Significant Maternal Lab Results: Group B Strep negative  No results found for this or any previous visit (from the past 24 hour(s)).  Patient Active Problem List   Diagnosis Date Noted  . Normal labor 10/23/2019  . Limited prenatal care in third trimester 09/27/2019  . 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 Supervision of low-risk pregnancy 04/09/2019  . Anemia   . Moderate major depression ([place]) 02/17/2018  . GAD (generalized anxiety disorder) 02/17/2018    Assessment: Tokiko Diefenderfer is a 24 y.o. G3P2002 at 63w5dhere for SOL  1. Labor: expectant management 2. FWB: Cat I tracing, EFW 7# 3. Pain: per patient request 4. GBS: negative   Plan: Admit to Labor and Delivery Anticipate NSVD  HMerilyn Baba DO  10/23/2019, 8:49 PM

## 2019-10-24 ENCOUNTER — Encounter (HOSPITAL_COMMUNITY): Payer: Self-pay | Admitting: Family Medicine

## 2019-10-24 ENCOUNTER — Other Ambulatory Visit: Payer: Medicaid Other

## 2019-10-24 ENCOUNTER — Encounter: Payer: Medicaid Other | Admitting: Family Medicine

## 2019-10-24 DIAGNOSIS — Z3A40 40 weeks gestation of pregnancy: Secondary | ICD-10-CM

## 2019-10-24 DIAGNOSIS — O480 Post-term pregnancy: Secondary | ICD-10-CM

## 2019-10-24 LAB — CBC
HCT: 31.6 % — ABNORMAL LOW (ref 36.0–46.0)
Hemoglobin: 10.5 g/dL — ABNORMAL LOW (ref 12.0–15.0)
MCH: 28.9 pg (ref 26.0–34.0)
MCHC: 33.2 g/dL (ref 30.0–36.0)
MCV: 87.1 fL (ref 80.0–100.0)
Platelets: 152 10*3/uL (ref 150–400)
RBC: 3.63 MIL/uL — ABNORMAL LOW (ref 3.87–5.11)
RDW: 14.9 % (ref 11.5–15.5)
WBC: 14.7 10*3/uL — ABNORMAL HIGH (ref 4.0–10.5)
nRBC: 0 % (ref 0.0–0.2)

## 2019-10-24 LAB — RPR: RPR Ser Ql: NONREACTIVE

## 2019-10-24 MED ORDER — SIMETHICONE 80 MG PO CHEW: 80.0000 mg | CHEWABLE_TABLET | ORAL | Status: DC | PRN

## 2019-10-24 MED ORDER — EPHEDRINE 5 MG/ML INJ: 10.0000 mg | INTRAVENOUS | Status: DC | PRN

## 2019-10-24 MED ORDER — BENZOCAINE-MENTHOL 20-0.5 % EX AERO: 1.0000 "application " | INHALATION_SPRAY | CUTANEOUS | Status: DC | PRN

## 2019-10-24 MED ORDER — PHENYLEPHRINE 40 MCG/ML (10ML) SYRINGE FOR IV PUSH (FOR BLOOD PRESSURE SUPPORT): 80.0000 ug | PREFILLED_SYRINGE | INTRAVENOUS | Status: DC | PRN

## 2019-10-24 MED ORDER — SENNOSIDES-DOCUSATE SODIUM 8.6-50 MG PO TABS
2.0000 | ORAL_TABLET | ORAL | Status: DC
  Administered 2019-10-25: 2 via ORAL
  Filled 2019-10-24: qty 2

## 2019-10-24 MED ORDER — IBUPROFEN 600 MG PO TABS
600.0000 mg | ORAL_TABLET | Freq: Four times a day (QID) | ORAL | Status: DC
  Administered 2019-10-24 – 2019-10-25 (×6): 600 mg via ORAL
  Filled 2019-10-24 (×6): qty 1

## 2019-10-24 MED ORDER — DIBUCAINE (PERIANAL) 1 % EX OINT: 1.0000 "application " | TOPICAL_OINTMENT | CUTANEOUS | Status: DC | PRN

## 2019-10-24 MED ORDER — OXYCODONE HCL 5 MG PO TABS: 5.0000 mg | ORAL_TABLET | ORAL | Status: DC | PRN

## 2019-10-24 MED ORDER — FENTANYL-BUPIVACAINE-NACL 0.5-0.125-0.9 MG/250ML-% EP SOLN: 12.0000 mL/h | EPIDURAL | Status: DC | PRN

## 2019-10-24 MED ORDER — ONDANSETRON HCL 4 MG PO TABS: 4.0000 mg | ORAL_TABLET | ORAL | Status: DC | PRN

## 2019-10-24 MED ORDER — DIPHENHYDRAMINE HCL 25 MG PO CAPS: 25.0000 mg | ORAL_CAPSULE | Freq: Four times a day (QID) | ORAL | Status: DC | PRN

## 2019-10-24 MED ORDER — WITCH HAZEL-GLYCERIN EX PADS: 1.0000 "application " | MEDICATED_PAD | CUTANEOUS | Status: DC | PRN

## 2019-10-24 MED ORDER — TETANUS-DIPHTH-ACELL PERTUSSIS 5-2.5-18.5 LF-MCG/0.5 IM SUSP: 0.5000 mL | Freq: Once | INTRAMUSCULAR | Status: DC

## 2019-10-24 MED ORDER — ONDANSETRON HCL 4 MG/2ML IJ SOLN: 4.0000 mg | INTRAMUSCULAR | Status: DC | PRN

## 2019-10-24 MED ORDER — LACTATED RINGERS IV SOLN: 500.0000 mL | Freq: Once | INTRAVENOUS | Status: DC

## 2019-10-24 MED ORDER — PRENATAL MULTIVITAMIN CH
1.0000 | ORAL_TABLET | Freq: Every day | ORAL | Status: DC
  Administered 2019-10-24 – 2019-10-25 (×2): 1 via ORAL
  Filled 2019-10-24 (×2): qty 1

## 2019-10-24 MED ORDER — ACETAMINOPHEN 325 MG PO TABS: 650.0000 mg | ORAL_TABLET | ORAL | Status: DC | PRN

## 2019-10-24 MED ORDER — COCONUT OIL OIL: 1.0000 "application " | TOPICAL_OIL | Status: DC | PRN

## 2019-10-24 MED ORDER — ZOLPIDEM TARTRATE 5 MG PO TABS: 5.0000 mg | ORAL_TABLET | Freq: Every evening | ORAL | Status: DC | PRN

## 2019-10-24 MED ORDER — OXYCODONE HCL 5 MG PO TABS: 10.0000 mg | ORAL_TABLET | ORAL | Status: DC | PRN

## 2019-10-24 MED ORDER — DIPHENHYDRAMINE HCL 50 MG/ML IJ SOLN: 12.5000 mg | INTRAMUSCULAR | Status: DC | PRN

## 2019-10-24 NOTE — Lactation Note (Signed)
This note was copied from a baby's chart. Lactation Consultation Note  Patient Name: [name] [id number] Date: 10/24/2019   Mother states she would like to formula feed.      Maternal Data    Feeding    LATCH Score                   Interventions    Lactation Tools Discussed/Used     Consult Status      Hardie Pulley 10/24/2019, 4:11 PM

## 2019-10-24 NOTE — Discharge Instructions (Signed)

## 2019-10-24 NOTE — Discharge Summary (Signed)
Postpartum Discharge Summary  Date of Service updated yes     Patient Name: Brooke Patel DOB: 1996-07-11 MRN: [id number]  Date of admission: 10/23/2019 Delivering Provider: Merilyn Baba   Date of discharge: 10/25/2019  Admitting diagnosis: Normal labor [O80, Z37.9] Intrauterine pregnancy: [redacted]w[redacted]d    Secondary diagnosis:  Active Problems:   Anemia   Moderate major depression (HCC)   GAD (generalized anxiety disorder)   Carrier of genetic disorder   Normal labor   SVD (spontaneous vaginal delivery)  Additional problems: intrapartum HTN     Discharge diagnosis: Term Pregnancy Delivered                                                                                                Post partum procedures:none  Augmentation: Pitocin  Complications: None  Hospital course:  Onset of Labor With Vaginal Delivery     24y.o. yo [id number] 447w6das admitted in Latent Labor on 10/23/2019. Patient had an uncomplicated labor course as follows:   Patient admitted in latent labor. Pitocin was added for augmentation after no cervical change. She SROMed, progressed to complete, and delivered shortly after.  Membrane Rupture Time/Date: 2:02 AM ,10/24/2019   Intrapartum Procedures: Episiotomy: None [1]                                         Lacerations:  None [1]  Patient had a delivery of a Viable infant. 10/24/2019  Information for the patient's newborn:  LiBrianny, Soulliereirl [id number]]Delivery Method: Vaginal, Spontaneous(Filed from Delivery Summary)     Pateint had an uncomplicated postpartum course.  She is ambulating, tolerating a regular diet, passing flatus, and urinating well. Patient is discharged home in stable condition on 10/25/19.  Delivery time: 2:08 AM    Magnesium Sulfate received: No BMZ received: No Rhophylac:N/A MMR:N/A Transfusion:No  Physical exam  Vitals:   10/24/19 1329 10/24/19 1754 10/24/19 2118 10/25/19 0540  BP: 115/75 122/74 115/77 115/86   Pulse: 74 68 90 65  Resp: _0 Temp: 98.5 F (36.9 C) 98.4 F (36.9 C) 98.1 F (36.7 C) 98.2 F (36.8 C)  TempSrc: Oral Oral Oral Oral  SpO2: 97% 99% 100%   Weight:      Height:       General: alert, cooperative and no distress Lochia: appropriate Uterine Fundus: firm DVT Evaluation: No evidence of DVT seen on physical exam. Negative Homan's sign. No cords or calf tenderness. No significant calf/ankle edema. Labs: Lab Results  Component Value Date   WBC 14.7 (H) 10/24/2019   HGB 10.5 (L) 10/24/2019   HCT 31.6 (L) 10/24/2019   MCV 87.1 10/24/2019   PLT 152 10/24/2019   CMP Latest Ref Rng & Units 10/08/2019  Glucose 65 - 99 mg/dL 74  BUN 6 - 20 mg/dL 11  Creatinine 0.57 - 1.00 mg/dL 0.67  Sodium 134 - 144 mmol/L 137  Potassium 3.5 - 5.2 mmol/L 4.0  Chloride 96 - 106 mmol/L  102  CO2 20 - 29 mmol/L 23  Calcium 8.7 - 10.2 mg/dL 9.1  Total Protein 6.0 - 8.5 g/dL 6.6  Total Bilirubin 0.0 - 1.2 mg/dL <0.2  Alkaline Phos 39 - 117 IU/L 128(H)  AST 0 - 40 IU/L 25  ALT 0 - 32 IU/L 13   Edinburgh Score: Edinburgh Postnatal Depression Scale Screening Tool 10/25/2019  I have been able to laugh and see the funny side of things. 0  I have looked forward with enjoyment to things. 0  I have blamed myself unnecessarily when things went wrong. 0  I have been anxious or worried for no good reason. 0  I have felt scared or panicky for no good reason. 0  Things have been getting on top of me. 0  I have been so unhappy that I have had difficulty sleeping. 0  I have felt sad or miserable. 0  I have been so unhappy that I have been crying. 0  The thought of harming myself has occurred to me. 0  Edinburgh Postnatal Depression Scale Total 0    Discharge instruction: per After Visit Summary and "Baby and Me Booklet".  After visit meds:  Allergies as of 10/25/2019   No Known Allergies     Medication List    STOP taking these medications   acetaminophen 325 MG  tablet Commonly known as: TYLENOL     TAKE these medications   Blood Pressure Kit Devi 1 Device by Does not apply route as needed. ICD 10 Z34.90   ibuprofen 600 MG tablet Commonly known as: ADVIL Take 1 tablet (600 mg total) by mouth every 6 (six) hours as needed.   Prenatal Vitamin 27-0.8 MG Tabs Take by mouth.       Diet: routine diet  Activity: Advance as tolerated. Pelvic rest for 6 weeks.   Outpatient follow up:4 weeks Follow up Appt: Future Appointments  Date Time Provider [place]  11/27/2019  9:35 AM Ardean Larsen, Mervyn Skeeters, CNM [facility] [place]   Follow up Visit: [place] for [facility] Follow up.   Specialty: Obstetrics and Gynecology Contact information: [address] [place], [place] [id number] [place] [id number] [phone]         Please schedule this patient for Postpartum visit in: 4 weeks with the following provider: Any provider Virtual ok For C/S patients schedule nurse incision check in weeks 2 weeks: no Low risk pregnancy complicated by: None Delivery mode:  SVD Anticipated Birth Control:  other/unsure PP Procedures needed: BP check in 1 week  Schedule Integrated [place] visit: no  Newborn Data: Live born female  Birth Weight:  5'15 APGAR: 76, 9  Newborn Delivery   Birth date/time: 10/24/2019 02:08:00 Delivery type: Vaginal, Spontaneous      Baby Feeding: Bottle Disposition:home with mother   10/25/2019 Julianne Handler, CNM

## 2019-10-24 NOTE — Plan of Care (Signed)
L&D careplan completed 

## 2019-10-25 ENCOUNTER — Inpatient Hospital Stay (HOSPITAL_COMMUNITY)
Admission: AD | Admit: 2019-10-25 | Payer: Medicaid Other | Source: Home / Self Care | Admitting: Obstetrics and Gynecology

## 2019-10-25 ENCOUNTER — Inpatient Hospital Stay (HOSPITAL_COMMUNITY): Payer: Medicaid Other

## 2019-10-25 MED ORDER — IBUPROFEN 600 MG PO TABS: 600.0000 mg | ORAL_TABLET | Freq: Four times a day (QID) | ORAL | 0 refills | Status: DC | PRN

## 2019-10-25 NOTE — Progress Notes (Signed)
Post Partum Day 1 after NSVD Subjective: Patient reports feeling well and no acute events overnight. Pain 2/10, reports satisfied with ibuprofen for pain control. Patient reports she is up ad lib, voiding, tolerating PO and + flatus. Patient decided to switch from breast to bottle feeding. She is still undecided on contraception.  Objective: Blood pressure 115/86, pulse 65, temperature 98.2 F (36.8 C), temperature source Oral, resp. rate 18, height 5\' 3"  (1.6 m), weight 64.4 kg, last menstrual period 01/11/2019, SpO2 100 %, unknown if currently breastfeeding.  Physical Exam:  General: alert, cooperative and no distress Lochia: appropriate Uterine Fundus: firm DVT Evaluation: No evidence of DVT seen on physical exam. No significant calf/ankle edema. Posterior tibial pulses present.  Recent Labs    10/23/19 2032 10/24/19 0545  HGB 11.8* 10.5*  HCT 35.3* 31.6*    Assessment/Plan: Brooke Patel is a G3P3 on post partum day 1. Continue routine postpartum care. Would like to go home today, however peds will likely keep baby 48 hours due to maternal fevers.   LOS: 2 days   Brooke Patel, Medical Student 10/25/2019, 7:36 AM

## 2019-10-25 NOTE — Progress Notes (Signed)
CSW received consult for hx of Anxiety and Depression.  CSW met with MOB to offer support and complete assessment.    CSW congratulated MOB and FOB on the birth of infant. CSW advised MOB of CSW's role and the reason for CSW coming to visit with her. MOB reported that she was diagnosed  with depression/anxeity last year. MOB reported that she was given medications but never took the medications as she wanted to cope with it on her own. CSW inquired from MOB on how she managed her anxiety and depression with out medications.MOB reported that just learned to control it on her own. CSW asked MOB if she has ever been in therapy for her anxiety or depression and anxiety reported that  she hasn't and declined resources at this time. CSW was notified by MOB that she has support from her family as well as as FOB at this time. MOB denies SI HI and reports that she has all needed items to care for infant.    MOB reports that infant will be seen at [facility] for follow up care.    CSW provided education regarding the baby blues period vs. perinatal mood disorders, discussed treatment and gave resources for mental health follow up if concerns arise.  CSW recommends self-evaluation during the postpartum time period using the New Mom Checklist from Postpartum Progress and encouraged MOB to contact a medical professional if symptoms are noted at any time.   CSW provided review of Sudden Infant Death Syndrome (SIDS) precautions.   CSW identifies no further need for intervention and no barriers to discharge at this time.     S. , MSW, LCSW [facility] [phone]   

## 2019-11-06 ENCOUNTER — Telehealth (INDEPENDENT_AMBULATORY_CARE_PROVIDER_SITE_OTHER): Payer: Medicaid Other | Admitting: *Deleted

## 2019-11-06 DIAGNOSIS — Z0130 Encounter for examination of blood pressure without abnormal findings: Secondary | ICD-10-CM

## 2019-11-06 NOTE — Progress Notes (Signed)
I have reviewed the chart and agree with nursing staff's documentation of this patient's encounter. Continue with current treatment plan.  Raelyn Mora, CNM 11/06/2019 4:09 PM

## 2019-11-06 NOTE — Progress Notes (Signed)
I connected with  Brooke Patel on 11/06/19 at  3:00 PM EDT by telephone and verified that I am speaking with the correct person using two identifiers.   I discussed the limitations, risks, security and privacy concerns of performing an evaluation and management service by telephone and the availability of in person appointments. I also discussed with the patient that there may be a patient responsible charge related to this service. The patient expressed understanding and agreed to proceed.  Pt stated that she is not having any H/A or blurry vision. She reports some occasional dizziness which goes away when she sits down. The frequency of dizziness is less than when she was pregnant. Pt was asked to check her BP during the call. She reported 133/85, P - 68. Pt has not checked her BP since discharge form the hospital. I advised pt that we want her to check the BP once per week. If she develops any sx of pre-e or has BP >140/90, she should go to [facility] for evaluation. Pt has PP appt via Mychart video on 5/4. She voiced understanding and agreed to plan of care.   , Drucilla Schmidt, RN 11/06/2019  3:18 PM

## 2019-11-27 ENCOUNTER — Telehealth (INDEPENDENT_AMBULATORY_CARE_PROVIDER_SITE_OTHER): Payer: Medicaid Other | Admitting: Student

## 2019-11-27 DIAGNOSIS — Z5329 Procedure and treatment not carried out because of patient's decision for other reasons: Secondary | ICD-10-CM

## 2019-11-27 DIAGNOSIS — Z91199 Patient's noncompliance with other medical treatment and regimen due to unspecified reason: Secondary | ICD-10-CM

## 2019-11-27 NOTE — Progress Notes (Signed)
Patient did not keep appt. She will need to reschedule.  

## 2019-11-27 NOTE — Progress Notes (Signed)
9:37a- Called  Pt for virtual visit, no answer, left VM stating will call back in 10 to 15 minutes.   10a-2nd attempt, still no answer, will need to reschedule.

## 2020-07-26 NOTE — L&D Delivery Note (Signed)
OB/GYN Faculty Practice Delivery Note  Brooke Patel is a 24 y.o. [id number] s/p SVD at [redacted]w[redacted]d. She was admitted for IOL for gHTN.   ROM: 1h 28m with clear fluid GBS Status:  Positive/-- (09/06 1625) Maximum Maternal Temperature:  Temp (24hrs), Avg:98.3 F (36.8 C), Min:97.9 F (36.6 C), Max:98.8 F (37.1 C)    Labor Progress: Patient arrived at 2 cm dilation and was induced with pitocin.   Delivery Date/Time: 04/16/2021 at 0031 Delivery: Called to room and patient was complete and pushing. Head delivered in ROA position. Shoulder ccord present. Delivered through with shoulder and body delivering in usual fashion. Infant with spontaneous cry, placed on mother's abdomen, dried and stimulated. Cord clamped x 2 after 1-minute delay, and cut by FOB. Cord blood drawn. Placenta delivered spontaneously with gentle cord traction. Fundus firm with massage and Pitocin. Labia, perineum, vagina, and cervix inspected with no lacerations.   Placenta: spontaneous, intact, three vessel cord  Complications: None  Lacerations: None  EBL: 175 Analgesia: IV pain meds    Infant: APGAR (1 MIN):  9 APGAR (5 MINS):  9 APGAR (10 MINS):    Weight: Pending   Derrel Nip, MD  PGY-3, [facility]  04/16/2021 12:44 AM

## 2020-09-24 ENCOUNTER — Encounter: Payer: Self-pay | Admitting: Nurse Practitioner

## 2020-09-24 ENCOUNTER — Ambulatory Visit (INDEPENDENT_AMBULATORY_CARE_PROVIDER_SITE_OTHER): Payer: Medicaid Other | Admitting: Nurse Practitioner

## 2020-09-24 ENCOUNTER — Other Ambulatory Visit (HOSPITAL_COMMUNITY)
Admission: RE | Admit: 2020-09-24 | Discharge: 2020-09-24 | Disposition: A | Discharge status: Home / Self Care | Payer: Medicaid Other | Source: Ambulatory Visit | Attending: Nurse Practitioner | Admitting: Nurse Practitioner

## 2020-09-24 ENCOUNTER — Other Ambulatory Visit: Payer: Self-pay

## 2020-09-24 VITALS — BP 111/64 | HR 80 | Wt 133.0 lb

## 2020-09-24 DIAGNOSIS — F411 Generalized anxiety disorder: Secondary | ICD-10-CM

## 2020-09-24 DIAGNOSIS — O09899 Supervision of other high risk pregnancies, unspecified trimester: Secondary | ICD-10-CM

## 2020-09-24 DIAGNOSIS — Z3491 Encounter for supervision of normal pregnancy, unspecified, first trimester: Secondary | ICD-10-CM

## 2020-09-24 DIAGNOSIS — F321 Major depressive disorder, single episode, moderate: Secondary | ICD-10-CM | POA: Diagnosis not present

## 2020-09-24 DIAGNOSIS — Z3A10 10 weeks gestation of pregnancy: Secondary | ICD-10-CM

## 2020-09-24 HISTORY — DX: Supervision of other high risk pregnancies, unspecified trimester: O09.899

## 2020-09-24 NOTE — Progress Notes (Signed)
Subjective:   Brooke Patel is a 25 y.o. G4P3003 at 74w1dby LMP being seen today for her first obstetrical visit.  Her obstetrical history is significant for short interpregnancy interval, nausea in pregnancy.. Patient is unsure about breastfeeding.  Three other children did not latch.. intend to breast feed. Pregnancy history fully reviewed.  Patient reports nausea.  HISTORY: OB History  Gravida Para Term Preterm AB Living  '4 3 3 ' 0 0 3  SAB IAB Ectopic Multiple Live Births  0 0 0 0 3    # Outcome Date GA Lbr Len/2nd Weight Sex Delivery Anes PTL Lv  4 Current           3 Term 10/24/19 430w6d9:02 / 00:06 5 lb 15.8 oz (2.716 kg) F Vag-Spont None  LIV     Name: Buccheri,GIRL Gerard     Apgar1: 9  Apgar5: 9  2 Term 2014 4098w0d lb (2.722 kg) F Vag-Spont Local  LIV     Birth Comments: no complications  1 Term 09/72/09/47w50w1d13 / 00:36 6 lb 9.6 oz (2.995 kg) F Vag-Spont Local  LIV     Birth Comments: no anomalies or problems     Name: Thrall,GIRL Sheron     Apgar1: 9  Apgar5: 9   Past Medical History:  Diagnosis Date  . Anemia    Past Surgical History:  Procedure Laterality Date  . NO PAST SURGERIES     Family History  Problem Relation Age of Onset  . Cancer Sister   . Diabetes Paternal Grandmother   . Cancer Paternal Grandmother    Social History   Tobacco Use  . Smoking status: Never Smoker  . Smokeless tobacco: Never Used  Vaping Use  . Vaping Use: Never used  Substance Use Topics  . Alcohol use: No  . Drug use: No   No Known Allergies Current Outpatient Medications on File Prior to Visit  Medication Sig Dispense Refill  . Blood Pressure Monitoring (BLOOD PRESSURE KIT) DEVI 1 Device by Does not apply route as needed. ICD 10 Z34.90 1 Device 0  . Prenatal Vit-Fe Fumarate-FA (PRENATAL VITAMIN) 27-0.8 MG TABS Take by mouth.     No current facility-administered medications on file prior to visit.     Exam   Vitals:   09/24/20 1421  BP: 111/64   Pulse: 80  Weight: 133 lb (60.3 kg)   Fetal Heart Rate (bpm): 164  Uterus:     Pelvic Exam: Perineum: Vaginal swab done   Vulva:    Vagina:     Cervix:    Adnexa:    Bony Pelvis:   System: General: well-developed, well-nourished female in no acute distress   Breast:  normal appearance, no masses or tenderness   Skin: normal coloration and turgor, no rashes   Neurologic: oriented, normal, negative, normal mood   Extremities: normal strength, tone, and muscle mass, ROM of all joints is normal   HEENT extraocular movement intact and sclera clear, anicteric   Mouth/Teeth deferred   Neck supple and no masses, normal thyroid   Cardiovascular: regular rate and rhythm   Respiratory:  no respiratory distress, normal breath sounds   Abdomen: soft, non-tender; no masses,  no organomegaly     Assessment:   Pregnancy: G4P3S9G2836ient Active Problem List   Diagnosis Date Noted  . Short interval between pregnancies affecting pregnancy, antepartum 09/24/2020  . 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 Supervision of low-risk pregnancy 04/09/2019  .  Anemia   . 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 GAD (generalized anxiety disorder) 02/17/2018     Plan:  1. Encounter for supervision of low-risk pregnancy in first trimester Nausea from time to time - advised eating small amounts of food every 2-3 hours.  No vomiting now. Had pap at PCP in January Has never breastfed - babies do not latch.  Given info in AVS on breastfeeding as she might consider this time due to news of contamination in formula and family encouragement to breastfeed. Reviewed Mychart and Babyscripts apps Will see navigator today.  - [place] MFM OB COMP + 14 WK; Future - Culture, OB Urine - Genetic Screening - CBC/D/Plt+RPR+Rh+ABO+Rub Ab... - Hemoglobin A1c - Babyscripts Schedule Optimization - Cervicovaginal ancillary only( )  2. Short interval between pregnancies affecting pregnancy,  antepartum Last birth less than a year ago.  Did not come for postpartum visit. Wondering about tubal ligation - advised it is permanent  3. Moderate major depression (HCC) Mood swings from time to time Is not seeing a provider at this time  - Ambulatory referral to [place]  4. GAD (generalized anxiety disorder)  - Ambulatory referral to [facility]     Initial labs drawn. Continue prenatal vitamins. Genetic Screening discussed, NIPS: ordered. Ultrasound discussed; fetal anatomic survey: ordered. Problem list reviewed and updated. The nature of [facility] with multiple MDs and other Advanced Practice Providers was explained to patient; also emphasized that residents, students are part of our team. Routine obstetric precautions reviewed. Return in about 4 weeks (around 10/22/2020) for ROB in person.  Total face-to-face time with patient: 40 minutes.  Over 50% of encounter was spent on counseling and coordination of care.     Earlie Server, FNP Family Nurse Practitioner, [facility] for [employer], [facility] 09/24/2020 5:07 PM

## 2020-09-25 LAB — CERVICOVAGINAL ANCILLARY ONLY
Chlamydia: NEGATIVE
Comment: NEGATIVE
Comment: NORMAL
Neisseria Gonorrhea: NEGATIVE

## 2020-09-26 ENCOUNTER — Encounter: Payer: Self-pay | Admitting: *Deleted

## 2020-09-26 LAB — CULTURE, OB URINE

## 2020-09-26 LAB — CBC/D/PLT+RPR+RH+ABO+RUB AB...
Basophils Absolute: 0 10*3/uL (ref 0.0–0.2)
Basos: 0 %
EOS (ABSOLUTE): 0.3 10*3/uL (ref 0.0–0.4)
Eos: 3 %
HCV Ab: <0.1 s/co ratio (ref 0.0–0.9)
HIV Screen 4th Generation wRfx: NONREACTIVE
Hematocrit: 36 % (ref 34.0–46.6)
Hemoglobin: 12 g/dL (ref 11.1–15.9)
Hepatitis B Surface Ag: NEGATIVE
Immature Grans (Abs): 0 10*3/uL (ref 0.0–0.1)
Immature Granulocytes: 0 %
Lymphocytes Absolute: 1.5 10*3/uL (ref 0.7–3.1)
Lymphs: 17 %
MCH: 28.5 pg (ref 26.6–33.0)
MCHC: 33.3 g/dL (ref 31.5–35.7)
MCV: 86 fL (ref 79–97)
Monocytes Absolute: 0.5 10*3/uL (ref 0.1–0.9)
Monocytes: 6 %
Neutrophils Absolute: 6.6 10*3/uL (ref 1.4–7.0)
Neutrophils: 74 %
Platelets: 221 10*3/uL (ref 150–450)
RBC: 4.21 x10E6/uL (ref 3.77–5.28)
RDW: 15.8 % — ABNORMAL HIGH (ref 11.7–15.4)
RPR Ser Ql: NONREACTIVE
Rh Factor: POSITIVE
Rubella Antibodies, IGG: 1.32 index (ref >0.99)
WBC: 9 10*3/uL (ref 3.4–10.8)

## 2020-09-26 LAB — AB SCR+ANTIBODY ID

## 2020-09-26 LAB — URINE CULTURE, OB REFLEX: Organism ID, Bacteria: NO GROWTH

## 2020-09-26 LAB — HEMOGLOBIN A1C
Est. average glucose Bld gHb Est-mCnc: 94 mg/dL
Hgb A1c MFr Bld: 4.9 % (ref 4.8–5.6)

## 2020-09-26 LAB — HCV INTERPRETATION

## 2020-10-06 NOTE — BH Specialist Note (Signed)
Pt did not arrive to video visit and did not answer the phone ; Left HIPPA-compliant message to call back  from [facility] at [facility] at [phone] (main office) or [phone] ('s office).  ; left MyChart message for patient.      

## 2020-10-13 ENCOUNTER — Encounter: Payer: Self-pay | Admitting: *Deleted

## 2020-10-14 ENCOUNTER — Ambulatory Visit: Payer: Medicaid Other | Admitting: Clinical

## 2020-10-14 DIAGNOSIS — Z5329 Procedure and treatment not carried out because of patient's decision for other reasons: Secondary | ICD-10-CM

## 2020-10-14 DIAGNOSIS — Z91199 Patient's noncompliance with other medical treatment and regimen due to unspecified reason: Secondary | ICD-10-CM

## 2020-10-27 ENCOUNTER — Ambulatory Visit (INDEPENDENT_AMBULATORY_CARE_PROVIDER_SITE_OTHER): Payer: Medicaid Other | Admitting: Nurse Practitioner

## 2020-10-27 ENCOUNTER — Other Ambulatory Visit: Payer: Self-pay

## 2020-10-27 ENCOUNTER — Encounter: Payer: Self-pay | Admitting: Nurse Practitioner

## 2020-10-27 VITALS — BP 113/74 | HR 97 | Wt 132.0 lb

## 2020-10-27 DIAGNOSIS — F321 Major depressive disorder, single episode, moderate: Secondary | ICD-10-CM

## 2020-10-27 DIAGNOSIS — O219 Vomiting of pregnancy, unspecified: Secondary | ICD-10-CM

## 2020-10-27 DIAGNOSIS — Z3A14 14 weeks gestation of pregnancy: Secondary | ICD-10-CM

## 2020-10-27 DIAGNOSIS — Z3491 Encounter for supervision of normal pregnancy, unspecified, first trimester: Secondary | ICD-10-CM

## 2020-10-27 MED ORDER — DOXYLAMINE-PYRIDOXINE 10-10 MG PO TBEC: DELAYED_RELEASE_TABLET | ORAL | 2 refills | Status: DC

## 2020-10-27 NOTE — Progress Notes (Signed)
Patient complains of occasional pain in pelvic area, stated that it feels like period cramps rates about 6. Also, complains of upper stomach pains, has it everyday and rates pain an 8

## 2020-10-27 NOTE — Progress Notes (Signed)
    Subjective:  Brooke Patel is a 25 y.o. G4P3003 at [redacted]w[redacted]d being seen today for ongoing prenatal care.  She is currently monitored for the following issues for this low-risk pregnancy and has Supervision of low-risk pregnancy; Anemia; Moderate major depression (HCC); GAD (generalized anxiety disorder); Carrier of genetic disorder; and Short interval between pregnancies affecting pregnancy, antepartum on their problem list.  Patient reports some lower abdominal cramping.  Contractions: Not present. Vag. Bleeding: None.  Movement: Absent. Denies leaking of fluid.   The following portions of the patient's history were reviewed and updated as appropriate: allergies, current medications, past family history, past medical history, past social history, past surgical history and problem list. Problem list updated.  Objective:   Vitals:   10/27/20 1550  BP: 113/74  Pulse: 97  Weight: 132 lb (59.9 kg)    Fetal Status: Fetal Heart Rate (bpm): 155 Fundal Height: 15 cm Movement: Absent     General:  Alert, oriented and cooperative. Patient is in no acute distress.  Skin: Skin is warm and dry. No rash noted.   Cardiovascular: Normal heart rate noted  Respiratory: Normal respiratory effort, no problems with respiration noted  Abdomen: Soft, gravid, appropriate for gestational age. Pain/Pressure: Present     Pelvic:  Cervical exam deferred        Extremities: Normal range of motion.  Edema: None  Mental Status: Normal mood and affect. Normal behavior. Normal judgment and thought content.   Urinalysis:      Assessment and Plan:  Pregnancy: G4P3003 at [redacted]w[redacted]d  1. Encounter for supervision of low-risk pregnancy in first trimester Some abdominal pain after eating Cramping especially at night.  Denies any urinary pain Is not drinking enough water and encouraged to drink 64 ounces daily to decrease cramping Saw navigator while in the office today Reports pain after fundal height was measured  today AFP at next visit  2. Moderate major depression (HCC) Missed appointment with [facility] - will reschedule  3. Nausea and vomiting in pregnancy Prescribed diclegis and reviewed how to take the medication  Preterm labor symptoms and general obstetric precautions including but not limited to vaginal bleeding, contractions, leaking of fluid and fetal movement were reviewed in detail with the patient. Please refer to After Visit Summary for other counseling recommendations.  Return in about 4 weeks (around 11/24/2020) for in person ROB.  Nolene Bernheim, RN, MSN, NP-BC Nurse Practitioner, [facility] for [employer], [facility] 10/27/2020 4:33 PM

## 2020-10-31 ENCOUNTER — Ambulatory Visit: Payer: Self-pay | Admitting: Clinical

## 2020-10-31 DIAGNOSIS — Z91199 Patient's noncompliance with other medical treatment and regimen due to unspecified reason: Secondary | ICD-10-CM

## 2020-10-31 DIAGNOSIS — Z5329 Procedure and treatment not carried out because of patient's decision for other reasons: Secondary | ICD-10-CM

## 2020-10-31 NOTE — BH Specialist Note (Signed)
Pt did not arrive to video visit and did not answer the phone ; Left HIPPA-compliant message to call back Asher Muir from [facility] [employer] at [facility] at [phone] (main office) or [phone] (Jamie's office).  ; left MyChart message for patient.  Bea Graff (Supervisor: Hulda Marin)

## 2020-11-04 ENCOUNTER — Encounter: Payer: Self-pay | Admitting: General Practice

## 2020-11-24 ENCOUNTER — Other Ambulatory Visit: Payer: Self-pay

## 2020-11-24 ENCOUNTER — Ambulatory Visit (INDEPENDENT_AMBULATORY_CARE_PROVIDER_SITE_OTHER): Payer: Medicaid Other | Admitting: Student

## 2020-11-24 VITALS — BP 114/65 | HR 84 | Wt 135.2 lb

## 2020-11-24 DIAGNOSIS — Z3490 Encounter for supervision of normal pregnancy, unspecified, unspecified trimester: Secondary | ICD-10-CM

## 2020-11-24 NOTE — Progress Notes (Signed)
   PRENATAL VISIT NOTE  Subjective:  Brooke Patel is a 25 y.o. G4P3003 at [redacted]w[redacted]d being seen today for ongoing prenatal care.  She is currently monitored for the following issues for this low-risk pregnancy and has Supervision of low-risk pregnancy; Anemia; Moderate major depression (HCC); GAD (generalized anxiety disorder); Carrier of genetic disorder; and Short interval between pregnancies affecting pregnancy, antepartum on their problem list.  Patient reports occasional pain in her back and stomach-at the top of her stomach in her chest. She notices it more at the end of the day and when she has been carrying her baby. .  Contractions: Not present. Vag. Bleeding: None.  Movement: Absent. Denies leaking of fluid.   The following portions of the patient's history were reviewed and updated as appropriate: allergies, current medications, past family history, past medical history, past social history, past surgical history and problem list.   Objective:   Vitals:   11/24/20 1622  BP: 114/65  Pulse: 84  Weight: 135 lb 3.2 oz (61.3 kg)    Fetal Status: Fetal Heart Rate (bpm): 147   Movement: Absent     General:  Alert, oriented and cooperative. Patient is in no acute distress.  Skin: Skin is warm and dry. No rash noted.   Cardiovascular: Normal heart rate noted  Respiratory: Normal respiratory effort, no problems with respiration noted  Abdomen: Soft, gravid, appropriate for gestational age.  Pain/Pressure: Absent     Pelvic: Cervical exam deferred        Extremities: Normal range of motion.  Edema: None  Mental Status: Normal mood and affect. Normal behavior. Normal judgment and thought content.   Assessment and Plan:  Pregnancy: G4P3003 at [redacted]w[redacted]d 1. Encounter for supervision of low-risk pregnancy, antepartum -keep appt tomorrow for [place]  - AFP, Serum, Open Spina Bifida -Discussed body mechanics and prevention of injuries, recommended getting in pool this summer to help with back  pain  Preterm labor symptoms and general obstetric precautions including but not limited to vaginal bleeding, contractions, leaking of fluid and fetal movement were reviewed in detail with the patient. Please refer to After Visit Summary for other counseling recommendations.   Return in about 3 weeks (around 12/15/2020), or LROB wiht KK.  Future Appointments  Date Time Provider Department Center  11/25/2020  1:45 PM [facility] US[facility]  12/15/2020  1:55 PM [place], Brand Males, NP [facility]    Marylene Land, CNM

## 2020-11-25 ENCOUNTER — Other Ambulatory Visit: Payer: Self-pay | Admitting: *Deleted

## 2020-11-25 ENCOUNTER — Other Ambulatory Visit: Payer: Self-pay | Admitting: Nurse Practitioner

## 2020-11-25 ENCOUNTER — Ambulatory Visit: Payer: Medicaid Other | Attending: Nurse Practitioner

## 2020-11-25 DIAGNOSIS — Z362 Encounter for other antenatal screening follow-up: Secondary | ICD-10-CM

## 2020-11-25 DIAGNOSIS — Z3491 Encounter for supervision of normal pregnancy, unspecified, first trimester: Secondary | ICD-10-CM | POA: Insufficient documentation

## 2020-11-25 LAB — AFP, SERUM, OPEN SPINA BIFIDA
AFP MoM: 0.85
AFP Value: 45.3 ng/mL
Gest. Age on Collection Date: 18.6 weeks
Maternal Age At EDD: 27.3 yr
OSBR Risk 1 IN: 10000
Test Results:: NEGATIVE
Weight: 135 [lb_av]

## 2020-12-15 ENCOUNTER — Other Ambulatory Visit: Payer: Self-pay

## 2020-12-15 ENCOUNTER — Ambulatory Visit (INDEPENDENT_AMBULATORY_CARE_PROVIDER_SITE_OTHER): Payer: Medicaid Other | Admitting: Nurse Practitioner

## 2020-12-15 VITALS — BP 114/68 | HR 90 | Wt 136.6 lb

## 2020-12-15 DIAGNOSIS — R12 Heartburn: Secondary | ICD-10-CM

## 2020-12-15 DIAGNOSIS — O26892 Other specified pregnancy related conditions, second trimester: Secondary | ICD-10-CM

## 2020-12-15 DIAGNOSIS — Z3490 Encounter for supervision of normal pregnancy, unspecified, unspecified trimester: Secondary | ICD-10-CM

## 2020-12-15 DIAGNOSIS — R42 Dizziness and giddiness: Secondary | ICD-10-CM

## 2020-12-15 DIAGNOSIS — M940 Chondrocostal junction syndrome [Tietze]: Secondary | ICD-10-CM

## 2020-12-15 DIAGNOSIS — Z3A21 21 weeks gestation of pregnancy: Secondary | ICD-10-CM

## 2020-12-15 MED ORDER — OMEPRAZOLE 40 MG PO CPDR: 40.0000 mg | DELAYED_RELEASE_CAPSULE | Freq: Every day | ORAL | 2 refills | Status: DC

## 2020-12-15 MED ORDER — PRENATAL VITAMIN 27-0.8 MG PO TABS: 1.0000 | ORAL_TABLET | Freq: Every day | ORAL | 11 refills | Status: DC

## 2020-12-15 NOTE — Patient Instructions (Addendum)
Intrauterine Device Information An intrauterine device (IUD) is a medical device that is inserted into the uterus to prevent pregnancy. It is a small, T-shaped device that has one or two nylon strings hanging down from it. The strings hang out of the lower part of the uterus (cervix) to allow for future IUD removal. There are two types of IUDs:  Hormone IUD. This type of IUD is made of plastic and contains the hormone progestin (synthetic progesterone). A hormone IUD may last 3-5 years.  Copper IUD. This type of IUD has copper wire wrapped around it. A copper IUD may last up to 10 years. How is an IUD inserted? An IUD is inserted through the vagina, through the cervix, and into the uterus with a minor medical procedure. The procedure for IUD insertion may vary among health care providers and hospitals. How does an IUD work? Synthetic progesterone in a hormonal IUD prevents pregnancy by:  Thickening cervical mucus to prevent sperm from entering the uterus.  Thinning the uterine lining to prevent a fertilized egg from being implanted there. Copper in a copper IUD prevents pregnancy by making the uterus and fallopian tubes produce a fluid that kills sperm. What are the advantages of an IUD? Advantages of either type of IUD An IUD:  Is highly effective in preventing pregnancy.  Is reversible. You can become pregnant shortly after the IUD is removed.  Is low-maintenance and can stay in place for a long time.  Has no estrogen-related side effects.  Can be used when breastfeeding.  Is not associated with weight gain.  Can be inserted right after childbirth, an abortion, or a miscarriage. Advantages of a hormone IUD  If it is inserted within 7 days of your period starting, it works right after it has been inserted. If the hormone IUD is inserted at any other time in your cycle, you will need to use a backup method of birth control for 7 days after insertion.  It can make menstrual periods  lighter or stop completely.  It can reduce menstrual cramping and other discomforts from menstrual periods.  It can be used for 3-5 years, depending on which IUD you have. Advantages of a copper IUD  It works right after it is inserted.  It can be used as a form of emergency birth control if it is inserted within 5 days after having unprotected sex.  It does not interfere with your body's natural hormones.  It can be used for up to 10 years. What are the disadvantages of an IUD?  An IUD may cause irregular menstrual bleeding for a period of time after insertion.  It is common to have pain during insertion and have cramping and vaginal bleeding after insertion.  An IUD may cut the uterus (uterine perforation) when it is inserted. This is rare.  Pelvic inflammatory disease (PID) may happen after insertion of an IUD. PID is an infection in the uterus and fallopian tubes. The IUD does not cause the infection. The infection is usually from an unknown sexually transmitted infection (STI). This is rare, and it usually happens during the first 20 days after the IUD is inserted.  A copper IUD can make your menstrual flow heavier and more painful.  IUDs cannot prevent sexually transmitted infections (STIs). How is an IUD removed?   You will lie on your back with your knees bent and your feet in footrests (stirrups).  A device will be inserted into your vagina to spread apart the vaginal walls (  speculum). This will allow your health care provider to see the strings attached to the IUD.  Your health care provider will use a small instrument (forceps) to grasp the IUD strings and will pull firmly until the IUD is removed. You may have some discomfort when the IUD is removed. Your health care provider may recommend taking over-the-counter pain relievers, such as ibuprofen, before the procedure. You may also have minor spotting for a few days after the procedure. The procedure for IUD removal may  vary among health care providers and hospitals. Is an IUD right for me? If you are interested in an IUD, discuss it with your health care provider. He or she will make sure you are a good candidate for an IUD and will let you know more about the advantages, disadvantage, and possible side effects. This will allow you to make a decision about the device. Summary  An intrauterine device (IUD) is a medical device that is inserted in the uterus to prevent pregnancy. It is a small, T-shaped device that has one or two nylon strings hanging down from it.  A hormone IUD contains the hormone progestin (synthetic progesterone). A copper IUD has copper wire wrapped around it.  Synthetic progesterone in a hormone IUD prevents pregnancy by thickening cervical mucus and thinning the walls of the uterus. Copper in a copper IUD prevents pregnancy by making the uterus and fallopian tubes produce a fluid that kills sperm.  A hormone IUD can be left in place for 3-5 years. A copper IUD can be left in place for up to 10 years.  An IUD is inserted and removed by a health care provider. You may feel some pain during insertion and removal. Your health care provider may recommend taking over-the-counter pain medicine, such as ibuprofen, before an IUD procedure. This information is not intended to replace advice given to you by your health care provider. Make sure you discuss any questions you have with your health care provider. Document Revised: 01/23/2020 Document Reviewed: 01/23/2020 Elsevier Patient Education  2021 Elsevier Inc.  Managing Anxiety, Adult After being diagnosed with an anxiety disorder, you may be relieved to know why you have felt or behaved a certain way. You may also feel overwhelmed about the treatment ahead and what it will mean for your life. With care and support, you can manage this condition and recover from it. How to manage lifestyle changes Managing stress and anxiety Stress is your  body's reaction to life changes and events, both good and bad. Most stress will last just a few hours, but stress can be ongoing and can lead to more than just stress. Although stress can play a major role in anxiety, it is not the same as anxiety. Stress is usually caused by something external, such as a deadline, test, or competition. Stress normally passes after the triggering event has ended.  Anxiety is caused by something internal, such as imagining a terrible outcome or worrying that something will go wrong that will devastate you. Anxiety often does not go away even after the triggering event is over, and it can become long-term (chronic) worry. It is important to understand the differences between stress and anxiety and to manage your stress effectively so that it does not lead to an anxious response. Talk with your health care provider or a counselor to learn more about reducing anxiety and stress. He or she may suggest tension reduction techniques, such as:  Music therapy. This can include creating or listening  to music that you enjoy and that inspires you.  Mindfulness-based meditation. This involves being aware of your normal breaths while not trying to control your breathing. It can be done while sitting or walking.  Centering prayer. This involves focusing on a word, phrase, or sacred image that means something to you and brings you peace.  Deep breathing. To do this, expand your stomach and inhale slowly through your nose. Hold your breath for 3-5 seconds. Then exhale slowly, letting your stomach muscles relax.  Self-talk. This involves identifying thought patterns that lead to anxiety reactions and changing those patterns.  Muscle relaxation. This involves tensing muscles and then relaxing them. Choose a tension reduction technique that suits your lifestyle and personality. These techniques take time and practice. Set aside 5-15 minutes a day to do them. Therapists can offer  counseling and training in these techniques. The training to help with anxiety may be covered by some insurance plans. Other things you can do to manage stress and anxiety include:  Keeping a stress/anxiety diary. This can help you learn what triggers your reaction and then learn ways to manage your response.  Thinking about how you react to certain situations. You may not be able to control everything, but you can control your response.  Making time for activities that help you relax and not feeling guilty about spending your time in this way.  Visual imagery and yoga can help you stay calm and relax.   Medicines Medicines can help ease symptoms. Medicines for anxiety include:  Anti-anxiety drugs.  Antidepressants. Medicines are often used as a primary treatment for anxiety disorder. Medicines will be prescribed by a health care provider. When used together, medicines, psychotherapy, and tension reduction techniques may be the most effective treatment. Relationships Relationships can play a big part in helping you recover. Try to spend more time connecting with trusted friends and family members. Consider going to couples counseling, taking family education classes, or going to family therapy. Therapy can help you and others better understand your condition. How to recognize changes in your anxiety Everyone responds differently to treatment for anxiety. Recovery from anxiety happens when symptoms decrease and stop interfering with your daily activities at home or work. This may mean that you will start to:  Have better concentration and focus. Worry will interfere less in your daily thinking.  Sleep better.  Be less irritable.  Have more energy.  Have improved memory. It is important to recognize when your condition is getting worse. Contact your health care provider if your symptoms interfere with home or work and you feel like your condition is not improving. Follow these  instructions at home: Activity  Exercise. Most adults should do the following: ? Exercise for at least 150 minutes each week. The exercise should increase your heart rate and make you sweat (moderate-intensity exercise). ? Strengthening exercises at least twice a week.  Get the right amount and quality of sleep. Most adults need 7-9 hours of sleep each night. Lifestyle  Eat a healthy diet that includes plenty of vegetables, fruits, whole grains, low-fat dairy products, and lean protein. Do not eat a lot of foods that are high in solid fats, added sugars, or salt.  Make choices that simplify your life.  Do not use any products that contain nicotine or tobacco, such as cigarettes, e-cigarettes, and chewing tobacco. If you need help quitting, ask your health care provider.  Avoid caffeine, alcohol, and certain over-the-counter cold medicines. These may make you feel  worse. Ask your pharmacist which medicines to avoid.   General instructions  Take over-the-counter and prescription medicines only as told by your health care provider.  Keep all follow-up visits as told by your health care provider. This is important. Where to find support You can get help and support from these sources:  Self-help groups.  Online and [employer].  A trusted spiritual leader.  Couples counseling.  Family education classes.  Family therapy. Where to find more information You may find that joining a support group helps you deal with your anxiety. The following sources can help you locate counselors or support groups near you:  Mental Health America: www.mentalhealthamerica.net  Anxiety and Depression Association of [place] (ADAA): ProgramCam.de  [employer] Mental Illness (NAMI): www.nami.org Contact a health care provider if you:  Have a hard time staying focused or finishing daily tasks.  Spend many hours a day feeling worried about everyday life.  Become exhausted by  worry.  Start to have headaches, feel tense, or have nausea.  Urinate more than normal.  Have diarrhea. Get help right away if you have:  A racing heart and shortness of breath.  Thoughts of hurting yourself or others. If you ever feel like you may hurt yourself or others, or have thoughts about taking your own life, get help right away. You can go to your nearest emergency department or call:  Your local emergency services (911 in the [place]  A suicide crisis helpline, such as the National Suicide Prevention Lifeline at (312) 502-6202. This is open 24 hours a day. Summary  Taking steps to learn and use tension reduction techniques can help calm you and help prevent triggering an anxiety reaction.  When used together, medicines, psychotherapy, and tension reduction techniques may be the most effective treatment.  Family, friends, and partners can play a big part in helping you recover from an anxiety disorder. This information is not intended to replace advice given to you by your health care provider. Make sure you discuss any questions you have with your health care provider. Document Revised: 12/12/2018 Document Reviewed: 12/12/2018 Elsevier Patient Education  2021 [employer].

## 2020-12-15 NOTE — Progress Notes (Signed)
Patient complains of chest discomfort. Stated that it feels like an "air pocket" that causes her to cough that happens occasionally

## 2020-12-15 NOTE — Progress Notes (Signed)
    Subjective:  Brooke Patel is a 25 y.o. G4P3003 at [redacted]w[redacted]d being seen today for ongoing prenatal care.  She is currently monitored for the following issues for this low-risk pregnancy and has Supervision of low-risk pregnancy; Anemia; Moderate major depression (HCC); GAD (generalized anxiety disorder); Carrier of genetic disorder; and Short interval between pregnancies affecting pregnancy, antepartum on their problem list.  Patient reports air coming up causing her to cough, pain in chest which worries her..  Contractions: Not present. Vag. Bleeding: None.  Movement: Present. Denies leaking of fluid.   The following portions of the patient's history were reviewed and updated as appropriate: allergies, current medications, past family history, past medical history, past social history, past surgical history and problem list. Problem list updated.  Objective:   Vitals:   12/15/20 1356  BP: 114/68  Pulse: 90  Weight: 136 lb 9.6 oz (62 kg)    Fetal Status: Fetal Heart Rate (bpm): 159 Fundal Height: 27 cm Movement: Present     General:  Alert, oriented and cooperative. Patient is in no acute distress.  Skin: Skin is warm and dry. No rash noted.   Cardiovascular: Normal heart rate noted.  Auscultation - no murmur, regular rate  Respiratory: Normal respiratory effort, no problems with respiration noted, clear bilaterally to auscultation  Abdomen: Soft, gravid, appropriate for gestational age. Pain/Pressure: Absent     Pelvic:  Cervical exam deferred        Extremities: Normal range of motion.  Edema: None  Mental Status: Normal mood and affect. Normal behavior. Normal judgment and thought content.   Urinalysis:      Assessment and Plan:  Pregnancy: [id number] at [redacted]w[redacted]d  1. Encounter for supervision of low-risk pregnancy, antepartum IUD info in AVS as she is still thinking about contraception and is very unsure if she wants BTL Reviewed upcoming [place] - baby was in 88% and today fundal height  is S>D  2. Heartburn during pregnancy in second trimester Describes air coming up that causes her to cough Will treat as heartburn and see if that will help her.  Will recheck in 3 weeks to see how she is feeling. Also informed she can take Tums as well for heartburn.  3. Dizziness Diet recall today - now 2:30 pm and has had cereal and a banana Needs more protein in her diet - reviewed with her. Drink at least 8 8-oz glasses of water every day.  4. Costochondritis Started prior to pregnancy. Causing chest discomfort in the front. Also notices when her baby is lying on her chest. Stimulating some anxiety - reassured and given info in AVS on handling anxiety. Palpation of chest bilaterally - reporting pain with palpation between the ribs on either side of the sternum. Taking tylenol but it is not helping.   Preterm labor symptoms and general obstetric precautions including but not limited to vaginal bleeding, contractions, leaking of fluid and fetal movement were reviewed in detail with the patient. Please refer to After Visit Summary for other counseling recommendations.  Return in about 3 weeks (around 01/05/2021) for In person ROB.  Nolene Bernheim, RN, MSN, NP-BC Nurse Practitioner, [facility] for [employer], [facility] 12/15/2020 2:24 PM

## 2020-12-25 ENCOUNTER — Ambulatory Visit: Payer: Medicaid Other | Admitting: *Deleted

## 2020-12-25 ENCOUNTER — Ambulatory Visit: Payer: Medicaid Other | Attending: Obstetrics

## 2020-12-25 ENCOUNTER — Other Ambulatory Visit: Payer: Self-pay

## 2020-12-25 ENCOUNTER — Encounter: Payer: Self-pay | Admitting: *Deleted

## 2020-12-25 VITALS — BP 113/66 | HR 74

## 2020-12-25 DIAGNOSIS — Z362 Encounter for other antenatal screening follow-up: Secondary | ICD-10-CM | POA: Diagnosis present

## 2020-12-25 DIAGNOSIS — O322XX0 Maternal care for transverse and oblique lie, not applicable or unspecified: Secondary | ICD-10-CM | POA: Diagnosis not present

## 2020-12-25 DIAGNOSIS — Z148 Genetic carrier of other disease: Secondary | ICD-10-CM

## 2020-12-25 DIAGNOSIS — O359XX0 Maternal care for (suspected) fetal abnormality and damage, unspecified, not applicable or unspecified: Secondary | ICD-10-CM | POA: Diagnosis not present

## 2020-12-25 DIAGNOSIS — Z3A23 23 weeks gestation of pregnancy: Secondary | ICD-10-CM | POA: Diagnosis not present

## 2021-01-12 ENCOUNTER — Other Ambulatory Visit: Payer: Self-pay

## 2021-01-12 ENCOUNTER — Ambulatory Visit (INDEPENDENT_AMBULATORY_CARE_PROVIDER_SITE_OTHER): Payer: Medicaid Other | Admitting: Nurse Practitioner

## 2021-01-12 VITALS — BP 119/65 | HR 114 | Wt 140.1 lb

## 2021-01-12 DIAGNOSIS — O26892 Other specified pregnancy related conditions, second trimester: Secondary | ICD-10-CM

## 2021-01-12 DIAGNOSIS — Z3490 Encounter for supervision of normal pregnancy, unspecified, unspecified trimester: Secondary | ICD-10-CM

## 2021-01-12 DIAGNOSIS — Z3A25 25 weeks gestation of pregnancy: Secondary | ICD-10-CM

## 2021-01-12 DIAGNOSIS — M940 Chondrocostal junction syndrome [Tietze]: Secondary | ICD-10-CM

## 2021-01-12 DIAGNOSIS — R42 Dizziness and giddiness: Secondary | ICD-10-CM

## 2021-01-12 DIAGNOSIS — R12 Heartburn: Secondary | ICD-10-CM

## 2021-01-12 NOTE — Progress Notes (Signed)
    Subjective:  Brooke Patel is a 25 y.o. G4P3003 at [redacted]w[redacted]d being seen today for ongoing prenatal care.  She is currently monitored for the following issues for this low-risk pregnancy and has Supervision of low-risk pregnancy; Anemia; Moderate major depression (HCC); GAD (generalized anxiety disorder); Carrier of genetic disorder; and Short interval between pregnancies affecting pregnancy, antepartum on their problem list.  Patient reports  pain in lower pelvis when walking or standing .  Contractions: Not present. Vag. Bleeding: None.  Movement: Present. Denies leaking of fluid.   The following portions of the patient's history were reviewed and updated as appropriate: allergies, current medications, past family history, past medical history, past social history, past surgical history and problem list. Problem list updated.  Objective:   Vitals:   01/12/21 1420  BP: 119/65  Pulse: (!) 114  Weight: 140 lb 1.6 oz (63.5 kg)    Fetal Status: Fetal Heart Rate (bpm): 146 Fundal Height: 27 cm Movement: Present     General:  Alert, oriented and cooperative. Patient is in no acute distress.  Skin: Skin is warm and dry. No rash noted.   Cardiovascular: Normal heart rate noted  Respiratory: Normal respiratory effort, no problems with respiration noted  Abdomen: Soft, gravid, appropriate for gestational age. Pain/Pressure: Present     Pelvic:  Cervical exam deferred        Extremities: Normal range of motion.  Edema: None  Mental Status: Normal mood and affect. Normal behavior. Normal judgment and thought content.   Urinalysis:      Assessment and Plan:  Pregnancy: G4P3003 at [redacted]w[redacted]d  1. Encounter for supervision of low-risk pregnancy, antepartum Reporting some pain in pelvic area when standing and walking - advised this is normal in thrid trimester for her 4th pregnancy. Accompanied by her 3 children today Feeling the baby move well.  2. Heartburn during pregnancy in second  trimester Not taking any medication - did not fill Prilosec presribed at last visit Is not longer coughing - resolved spontaneously Has not tried Tums either  3. Dizziness Spontaneously resolved without treatment  4. Costochondritis Spontaneously resolved without treatment since last visit  5. Anxiety Made appointment for [facility] but she did not come in April Still having issues with anxiety - does not go out of the house without her husband or her sister.  Wants to see [facility] but has all of her children - advised to schedule virtual visit  Preterm labor symptoms and general obstetric precautions including but not limited to vaginal bleeding, contractions, leaking of fluid and fetal movement were reviewed in detail with the patient. Please refer to After Visit Summary for other counseling recommendations.  Return in about 3 weeks (around 02/02/2021) for early am for fasting  glucola and ROB.  Nolene Bernheim, RN, MSN, NP-BC Nurse Practitioner, [facility] for [employer], [facility] 01/12/2021 2:42 PM

## 2021-01-12 NOTE — Progress Notes (Signed)
Patient complains of pain and pressure in pelvis when she is walking or standing for long periods of time

## 2021-01-14 NOTE — BH Specialist Note (Signed)
Pt did not arrive to video visit and did not answer the phone ; message when calling pt"number is not in service" and pt's alternate number is the same number,  so unable to leave a voice message; left MyChart message for patient.

## 2021-01-20 ENCOUNTER — Ambulatory Visit: Payer: Medicaid Other | Admitting: Clinical

## 2021-01-20 DIAGNOSIS — Z91199 Patient's noncompliance with other medical treatment and regimen due to unspecified reason: Secondary | ICD-10-CM

## 2021-02-04 ENCOUNTER — Other Ambulatory Visit: Payer: Self-pay

## 2021-02-04 ENCOUNTER — Ambulatory Visit (INDEPENDENT_AMBULATORY_CARE_PROVIDER_SITE_OTHER): Payer: Medicaid Other | Admitting: Certified Nurse Midwife

## 2021-02-04 ENCOUNTER — Other Ambulatory Visit: Payer: Medicaid Other

## 2021-02-04 VITALS — BP 107/66 | HR 100 | Wt 142.2 lb

## 2021-02-04 DIAGNOSIS — Z23 Encounter for immunization: Secondary | ICD-10-CM | POA: Diagnosis not present

## 2021-02-04 DIAGNOSIS — Z3493 Encounter for supervision of normal pregnancy, unspecified, third trimester: Secondary | ICD-10-CM

## 2021-02-04 DIAGNOSIS — R1084 Generalized abdominal pain: Secondary | ICD-10-CM

## 2021-02-04 DIAGNOSIS — Z3A29 29 weeks gestation of pregnancy: Secondary | ICD-10-CM

## 2021-02-04 NOTE — Progress Notes (Signed)
Patient complains of pain in upper abdomen. Stated that she does not know if it is contractions but it has been happening daily since beginning of week

## 2021-02-04 NOTE — Progress Notes (Signed)
   PRENATAL VISIT NOTE  Subjective:  Brooke Patel is a 25 y.o. G4P3003 at [redacted]w[redacted]d being seen today for ongoing prenatal care.  She is currently monitored for the following issues for this low-risk pregnancy and has Supervision of low-risk pregnancy; Anemia; Moderate major depression (HCC); GAD (generalized anxiety disorder); Carrier of genetic disorder; and Short interval between pregnancies affecting pregnancy, antepartum on their problem list.  Patient reports  intermittent upper abdominal pain. She states that is has been happening everyday for the last week. She rates the pain 3/10 when it occurs and states that it subsides fairly quickly.She endorses some heartburn that is managed well by [place]. She denies nausea, vomiting, and other reflux symptoms.  .  Contractions: Irritability. Vag. Bleeding: None.  Movement: Present. Denies leaking of fluid.   The following portions of the patient's history were reviewed and updated as appropriate: allergies, current medications, past family history, past medical history, past social history, past surgical history and problem list.   Objective:   Vitals:   02/04/21 0926  BP: 107/66  Pulse: 100  Weight: 64.5 kg    Fetal Status: Fetal Heart Rate (bpm): 134 Fundal Height: 27 cm Movement: Present    Fetal positioning Transverse by [place].   General:  Alert, oriented and cooperative. Patient is in no acute distress.  Skin: Skin is warm and dry. No rash noted.   Cardiovascular: Normal heart rate noted  Respiratory: Normal respiratory effort, no problems with respiration noted  Abdomen: Soft, gravid, appropriate for gestational age.  Pain/Pressure: Present     Pelvic: Cervical exam deferred        Extremities: Normal range of motion.  Edema: None  Mental Status: Normal mood and affect. Normal behavior. Normal judgment and thought content.   Assessment and Plan:  Pregnancy: G4P3003 at [redacted]w[redacted]d 1. [redacted] weeks gestation of pregnancy - Brooke Patel is  overall doing well, feeling daily vigorous fetal movement.  - Tdap vaccine greater than or equal to 7yo IM  2. Supervision of low-risk pregnancy, third trimester - GTT today. Results pending.    3. Generalized abdominal pain - Discussed normal discomforts of pregnancy as fetus grows.  -Education provided on braxton hicks contractions, and how fetal positioning can cause abdominal discomfort.   In discussion with the patient, she had questions about when and how babies transition from breech to vertex. Pt reassured of time frame, and current position. Also discussed options of ECV if necessary.    Preterm labor symptoms and general obstetric precautions including but not limited to vaginal bleeding, contractions, leaking of fluid and fetal movement were reviewed in detail with the patient. Please refer to After Visit Summary for other counseling recommendations.   Return in about 2 weeks (around 02/18/2021) for LROB.  Future Appointments  Date Time Provider Department Center  02/19/2021  1:15 PM Brand Males, CNM [facility]    Signed:  Warrick Parisian Danella Deis) Suzie Portela, BSN, RNC-OB  Student Nurse-Midwife   02/04/2021  11:59 AM

## 2021-02-05 LAB — CBC
Hematocrit: 30.4 % — ABNORMAL LOW (ref 34.0–46.6)
Hemoglobin: 10.1 g/dL — ABNORMAL LOW (ref 11.1–15.9)
MCH: 27.9 pg (ref 26.6–33.0)
MCHC: 33.2 g/dL (ref 31.5–35.7)
MCV: 84 fL (ref 79–97)
Platelets: 189 10*3/uL (ref 150–450)
RBC: 3.62 x10E6/uL — ABNORMAL LOW (ref 3.77–5.28)
RDW: 14 % (ref 11.7–15.4)
WBC: 9.5 10*3/uL (ref 3.4–10.8)

## 2021-02-05 LAB — GLUCOSE TOLERANCE, 2 HOURS W/ 1HR
Glucose, 1 hour: 174 mg/dL (ref 65–179)
Glucose, 2 hour: 146 mg/dL (ref 65–152)
Glucose, Fasting: 89 mg/dL (ref 65–91)

## 2021-02-05 LAB — RPR: RPR Ser Ql: NONREACTIVE

## 2021-02-05 LAB — HIV ANTIBODY (ROUTINE TESTING W REFLEX): HIV Screen 4th Generation wRfx: NONREACTIVE

## 2021-02-06 ENCOUNTER — Other Ambulatory Visit: Payer: Self-pay | Admitting: Certified Nurse Midwife

## 2021-02-06 DIAGNOSIS — O99019 Anemia complicating pregnancy, unspecified trimester: Secondary | ICD-10-CM

## 2021-02-06 DIAGNOSIS — D509 Iron deficiency anemia, unspecified: Secondary | ICD-10-CM

## 2021-02-06 MED ORDER — POLYSACCHARIDE IRON COMPLEX 150 MG PO CAPS: 150.0000 mg | ORAL_CAPSULE | Freq: Every day | ORAL | 2 refills | Status: DC

## 2021-03-09 ENCOUNTER — Other Ambulatory Visit: Payer: Self-pay

## 2021-03-09 ENCOUNTER — Ambulatory Visit (INDEPENDENT_AMBULATORY_CARE_PROVIDER_SITE_OTHER): Payer: Medicaid Other | Admitting: Obstetrics & Gynecology

## 2021-03-09 VITALS — BP 116/84 | HR 99 | Wt 145.8 lb

## 2021-03-09 DIAGNOSIS — Z3493 Encounter for supervision of normal pregnancy, unspecified, third trimester: Secondary | ICD-10-CM

## 2021-03-09 DIAGNOSIS — O09899 Supervision of other high risk pregnancies, unspecified trimester: Secondary | ICD-10-CM

## 2021-03-09 NOTE — Progress Notes (Signed)
   PRENATAL VISIT NOTE  Subjective:  Hollee Fate is a 25 y.o. G4P3003 at [redacted]w[redacted]d being seen today for ongoing prenatal care.  She is currently monitored for the following issues for this low-risk pregnancy and has Supervision of low-risk pregnancy; Anemia; Moderate major depression (HCC); GAD (generalized anxiety disorder); Carrier of genetic disorder; and Short interval between pregnancies affecting pregnancy, antepartum on their problem list.  Patient reports no complaints.  Contractions: Irritability. Vag. Bleeding: None.  Movement: Present. Denies leaking of fluid.   The following portions of the patient's history were reviewed and updated as appropriate: allergies, current medications, past family history, past medical history, past social history, past surgical history and problem list.   Objective:   Vitals:   03/09/21 1556  BP: 116/84  Pulse: 99  Weight: 145 lb 12.8 oz (66.1 kg)    Fetal Status: Fetal Heart Rate (bpm): 140   Movement: Present     General:  Alert, oriented and cooperative. Patient is in no acute distress.  Skin: Skin is warm and dry. No rash noted.   Cardiovascular: Normal heart rate noted  Respiratory: Normal respiratory effort, no problems with respiration noted  Abdomen: Soft, gravid, appropriate for gestational age.  Pain/Pressure: Present     Pelvic: Cervical exam deferred        Extremities: Normal range of motion.  Edema: None  Mental Status: Normal mood and affect. Normal behavior. Normal judgment and thought content.   Assessment and Plan:  Pregnancy: G4P3003 at [redacted]w[redacted]d 1. Encounter for supervision of low-risk pregnancy in third trimester Doing well  2. Short interval between pregnancies affecting pregnancy, antepartum No problems  Preterm labor symptoms and general obstetric precautions including but not limited to vaginal bleeding, contractions, leaking of fluid and fetal movement were reviewed in detail with the patient. Please refer to After  Visit Summary for other counseling recommendations.   Return in about 2 weeks (around 03/23/2021).  No future appointments.  Scheryl Darter, MD

## 2021-03-31 ENCOUNTER — Other Ambulatory Visit: Payer: Self-pay

## 2021-03-31 ENCOUNTER — Other Ambulatory Visit (HOSPITAL_COMMUNITY)
Admission: RE | Admit: 2021-03-31 | Discharge: 2021-03-31 | Disposition: A | Discharge status: Home / Self Care | Payer: Medicaid Other | Source: Ambulatory Visit | Attending: Nurse Practitioner | Admitting: Nurse Practitioner

## 2021-03-31 ENCOUNTER — Ambulatory Visit (INDEPENDENT_AMBULATORY_CARE_PROVIDER_SITE_OTHER): Payer: Medicaid Other | Admitting: Nurse Practitioner

## 2021-03-31 VITALS — BP 123/85 | HR 90 | Wt 150.0 lb

## 2021-03-31 DIAGNOSIS — Z3493 Encounter for supervision of normal pregnancy, unspecified, third trimester: Secondary | ICD-10-CM

## 2021-03-31 DIAGNOSIS — F411 Generalized anxiety disorder: Secondary | ICD-10-CM

## 2021-03-31 DIAGNOSIS — Z3A37 37 weeks gestation of pregnancy: Secondary | ICD-10-CM

## 2021-03-31 NOTE — Progress Notes (Signed)
    Subjective:  Brooke Patel is a 25 y.o. G4P3003 at [redacted]w[redacted]d being seen today for ongoing prenatal care.  She is currently monitored for the following issues for this low-risk pregnancy and has Supervision of low-risk pregnancy; Anemia; Moderate major depression (HCC); GAD (generalized anxiety disorder); Carrier of genetic disorder; and Short interval between pregnancies affecting pregnancy, antepartum on their problem list.  Patient reports  pelvic pain .  Contractions: Irritability. Vag. Bleeding: None.  Movement: Present. Denies leaking of fluid.   The following portions of the patient's history were reviewed and updated as appropriate: allergies, current medications, past family history, past medical history, past social history, past surgical history and problem list. Problem list updated.  Objective:   Vitals:   03/31/21 1602  BP: 123/85  Pulse: 90  Weight: 150 lb (68 kg)    Fetal Status: Fetal Heart Rate (bpm): 143 Fundal Height: 37 cm Movement: Present  Presentation: Vertex  General:  Alert, oriented and cooperative. Patient is in no acute distress.  Skin: Skin is warm and dry. No rash noted.   Cardiovascular: Normal heart rate noted  Respiratory: Normal respiratory effort, no problems with respiration noted  Abdomen: Soft, gravid, appropriate for gestational age. Pain/Pressure: Present     Pelvic:  Cervical exam performed Dilation: Closed Effacement (%): Thick Station: -2  Extremities: Normal range of motion.  Edema: None  Mental Status: Normal mood and affect. Normal behavior. Normal judgment and thought content.   Urinalysis:      Assessment and Plan:  Pregnancy: G4P3003 at [redacted]w[redacted]d  1. Encounter for supervision of low-risk pregnancy in third trimester Pubic symphysis pain - normal for her late fourth pregnancy Normal BP - no edema in ankles Baby moving well  2. GAD (generalized anxiety disorder) Anxiety is improving  3. [redacted] weeks gestation of pregnancy  - Culture,  beta strep (group b only) - Cervicovaginal ancillary only( Eva)  Term labor symptoms and general obstetric precautions including but not limited to vaginal bleeding, contractions, leaking of fluid and fetal movement were reviewed in detail with the patient. Please refer to After Visit Summary for other counseling recommendations.  Return in about 1 week (around 04/07/2021) for in person ROB.  Nolene Bernheim, RN, MSN, NP-BC Nurse Practitioner, [facility] for [employer], [facility] 03/31/2021 4:21 PM

## 2021-03-31 NOTE — Progress Notes (Signed)
Patient reports pain in pelvis and thighs when she walks or sitting for long periods of time

## 2021-04-02 LAB — CERVICOVAGINAL ANCILLARY ONLY
Chlamydia: NEGATIVE
Comment: NEGATIVE
Comment: NORMAL
Neisseria Gonorrhea: NEGATIVE

## 2021-04-03 ENCOUNTER — Encounter: Payer: Self-pay | Admitting: Nurse Practitioner

## 2021-04-03 DIAGNOSIS — O99820 Streptococcus B carrier state complicating pregnancy: Secondary | ICD-10-CM | POA: Insufficient documentation

## 2021-04-03 HISTORY — DX: Streptococcus B carrier state complicating pregnancy: O99.820

## 2021-04-03 LAB — CULTURE, BETA STREP (GROUP B ONLY): Strep Gp B Culture: POSITIVE — AB

## 2021-04-09 ENCOUNTER — Other Ambulatory Visit: Payer: Self-pay

## 2021-04-09 ENCOUNTER — Encounter: Payer: Self-pay | Admitting: Nurse Practitioner

## 2021-04-09 ENCOUNTER — Ambulatory Visit (INDEPENDENT_AMBULATORY_CARE_PROVIDER_SITE_OTHER): Payer: Medicaid Other | Admitting: Nurse Practitioner

## 2021-04-09 VITALS — BP 135/84 | HR 72 | Wt 150.4 lb

## 2021-04-09 DIAGNOSIS — Z3A38 38 weeks gestation of pregnancy: Secondary | ICD-10-CM

## 2021-04-09 DIAGNOSIS — O99820 Streptococcus B carrier state complicating pregnancy: Secondary | ICD-10-CM

## 2021-04-09 DIAGNOSIS — Z3493 Encounter for supervision of normal pregnancy, unspecified, third trimester: Secondary | ICD-10-CM

## 2021-04-09 NOTE — Progress Notes (Signed)
    Subjective:  Brooke Patel is a 25 y.o. G4P3003 at [redacted]w[redacted]d being seen today for ongoing prenatal care.  She is currently monitored for the following issues for this low-risk pregnancy and has Supervision of low-risk pregnancy; Anemia; Moderate major depression (HCC); GAD (generalized anxiety disorder); Carrier of genetic disorder; Short interval between pregnancies affecting pregnancy, antepartum; and Group B Streptococcus carrier, +RV culture, currently pregnant on their problem list.  Patient reports no complaints.  Contractions: Irritability. Vag. Bleeding: None.  Movement: Present. Denies leaking of fluid.   The following portions of the patient's history were reviewed and updated as appropriate: allergies, current medications, past family history, past medical history, past social history, past surgical history and problem list. Problem list updated.  Objective:   Vitals:   04/09/21 1059  BP: 135/84  Pulse: 72  Weight: 150 lb 6.4 oz (68.2 kg)    Fetal Status: Fetal Heart Rate (bpm): 134   Movement: Present     General:  Alert, oriented and cooperative. Patient is in no acute distress.  Skin: Skin is warm and dry. No rash noted.   Cardiovascular: Normal heart rate noted  Respiratory: Normal respiratory effort, no problems with respiration noted  Abdomen: Soft, gravid, appropriate for gestational age. Pain/Pressure: Present     Pelvic:  Cervical exam deferred        Extremities: Normal range of motion.  Edema: None  Mental Status: Normal mood and affect. Normal behavior. Normal judgment and thought content.   Urinalysis:      Assessment and Plan:  Pregnancy: G4P3003 at [redacted]w[redacted]d  1. Encounter for supervision of low-risk pregnancy in third trimester Doing well.  No edema.  2 children accompany her today  2. Group B Streptococcus carrier, +RV culture, currently pregnant Reviewed with patient  3. [redacted] weeks gestation of pregnancy   Term labor symptoms and general obstetric  precautions including but not limited to vaginal bleeding, contractions, leaking of fluid and fetal movement were reviewed in detail with the patient. Please refer to After Visit Summary for other counseling recommendations.  Return in about 1 week (around 04/16/2021) for in person ROB.  Nolene Bernheim, RN, MSN, NP-BC Nurse Practitioner, [facility] [employer], [facility] 04/09/2021 11:09 AM

## 2021-04-15 ENCOUNTER — Ambulatory Visit (INDEPENDENT_AMBULATORY_CARE_PROVIDER_SITE_OTHER): Payer: Medicaid Other | Admitting: Obstetrics & Gynecology

## 2021-04-15 ENCOUNTER — Other Ambulatory Visit: Payer: Self-pay

## 2021-04-15 ENCOUNTER — Inpatient Hospital Stay (HOSPITAL_COMMUNITY)
Admission: AD | Admit: 2021-04-15 | Discharge: 2021-04-17 | DRG: 807 | Disposition: A | Discharge status: Home / Self Care | Payer: Medicaid Other | Attending: Obstetrics & Gynecology | Admitting: Obstetrics & Gynecology

## 2021-04-15 ENCOUNTER — Encounter (HOSPITAL_COMMUNITY): Payer: Self-pay | Admitting: Obstetrics and Gynecology

## 2021-04-15 VITALS — BP 134/92 | HR 76 | Wt 150.5 lb

## 2021-04-15 DIAGNOSIS — Z20822 Contact with and (suspected) exposure to covid-19: Secondary | ICD-10-CM | POA: Diagnosis present

## 2021-04-15 DIAGNOSIS — O99820 Streptococcus B carrier state complicating pregnancy: Secondary | ICD-10-CM

## 2021-04-15 DIAGNOSIS — O133 Gestational [pregnancy-induced] hypertension without significant proteinuria, third trimester: Secondary | ICD-10-CM

## 2021-04-15 DIAGNOSIS — O134 Gestational [pregnancy-induced] hypertension without significant proteinuria, complicating childbirth: Principal | ICD-10-CM | POA: Diagnosis present

## 2021-04-15 DIAGNOSIS — O99824 Streptococcus B carrier state complicating childbirth: Secondary | ICD-10-CM | POA: Diagnosis present

## 2021-04-15 DIAGNOSIS — Z3A39 39 weeks gestation of pregnancy: Secondary | ICD-10-CM | POA: Diagnosis not present

## 2021-04-15 DIAGNOSIS — O139 Gestational [pregnancy-induced] hypertension without significant proteinuria, unspecified trimester: Secondary | ICD-10-CM | POA: Diagnosis present

## 2021-04-15 DIAGNOSIS — Z3493 Encounter for supervision of normal pregnancy, unspecified, third trimester: Secondary | ICD-10-CM

## 2021-04-15 DIAGNOSIS — R030 Elevated blood-pressure reading, without diagnosis of hypertension: Secondary | ICD-10-CM | POA: Diagnosis not present

## 2021-04-15 DIAGNOSIS — O09899 Supervision of other high risk pregnancies, unspecified trimester: Secondary | ICD-10-CM

## 2021-04-15 LAB — RESP PANEL BY RT-PCR (FLU A&B, COVID) ARPGX2
Influenza A by PCR: NEGATIVE
Influenza B by PCR: NEGATIVE
SARS Coronavirus 2 by RT PCR: NEGATIVE

## 2021-04-15 LAB — CBC
HCT: 32.1 % — ABNORMAL LOW (ref 36.0–46.0)
Hemoglobin: 10.5 g/dL — ABNORMAL LOW (ref 12.0–15.0)
MCH: 27.7 pg (ref 26.0–34.0)
MCHC: 32.7 g/dL (ref 30.0–36.0)
MCV: 84.7 fL (ref 80.0–100.0)
Platelets: 157 10*3/uL (ref 150–400)
RBC: 3.79 MIL/uL — ABNORMAL LOW (ref 3.87–5.11)
RDW: 16.4 % — ABNORMAL HIGH (ref 11.5–15.5)
WBC: 8.4 10*3/uL (ref 4.0–10.5)
nRBC: 0.2 % (ref 0.0–0.2)

## 2021-04-15 LAB — COMPREHENSIVE METABOLIC PANEL
ALT: 13 U/L (ref 0–44)
AST: 25 U/L (ref 15–41)
Albumin: 2.8 g/dL — ABNORMAL LOW (ref 3.5–5.0)
Alkaline Phosphatase: 159 U/L — ABNORMAL HIGH (ref 38–126)
Anion gap: 11 (ref 5–15)
BUN: 9 mg/dL (ref 6–20)
CO2: 22 mmol/L (ref 22–32)
Calcium: 8.7 mg/dL — ABNORMAL LOW (ref 8.9–10.3)
Chloride: 101 mmol/L (ref 98–111)
Creatinine, Ser: 0.85 mg/dL (ref 0.44–1.00)
GFR, Estimated: >60 mL/min (ref >60)
Glucose, Bld: 75 mg/dL (ref 70–99)
Potassium: 3.9 mmol/L (ref 3.5–5.1)
Sodium: 134 mmol/L — ABNORMAL LOW (ref 135–145)
Total Bilirubin: 0.7 mg/dL (ref 0.3–1.2)
Total Protein: 6.2 g/dL — ABNORMAL LOW (ref 6.5–8.1)

## 2021-04-15 LAB — PROTEIN / CREATININE RATIO, URINE
Creatinine, Urine: 84.63 mg/dL
Protein Creatinine Ratio: 0.09 mg/mg{Cre} (ref 0.00–0.15)
Total Protein, Urine: 8 mg/dL

## 2021-04-15 LAB — TYPE AND SCREEN
ABO/RH(D): A POS
Antibody Screen: NEGATIVE

## 2021-04-15 MED ORDER — OXYTOCIN-SODIUM CHLORIDE 30-0.9 UT/500ML-% IV SOLN
1.0000 m[IU]/min | INTRAVENOUS | Status: DC
  Administered 2021-04-15: 2 m[IU]/min via INTRAVENOUS
  Filled 2021-04-15: qty 500

## 2021-04-15 MED ORDER — PENICILLIN G POT IN DEXTROSE 60000 UNIT/ML IV SOLN
3.0000 10*6.[IU] | Freq: Once | INTRAVENOUS | Status: AC
  Administered 2021-04-15: 3 10*6.[IU] via INTRAVENOUS
  Filled 2021-04-15: qty 50

## 2021-04-15 MED ORDER — LACTATED RINGERS IV SOLN: 500.0000 mL | INTRAVENOUS | Status: DC | PRN

## 2021-04-15 MED ORDER — OXYCODONE-ACETAMINOPHEN 5-325 MG PO TABS: 2.0000 | ORAL_TABLET | ORAL | Status: DC | PRN

## 2021-04-15 MED ORDER — OXYCODONE-ACETAMINOPHEN 5-325 MG PO TABS: 1.0000 | ORAL_TABLET | ORAL | Status: DC | PRN

## 2021-04-15 MED ORDER — ACETAMINOPHEN 325 MG PO TABS: 650.0000 mg | ORAL_TABLET | ORAL | Status: DC | PRN

## 2021-04-15 MED ORDER — OXYTOCIN BOLUS FROM INFUSION
333.0000 mL | Freq: Once | INTRAVENOUS | Status: AC
  Administered 2021-04-16: 333 mL via INTRAVENOUS

## 2021-04-15 MED ORDER — LIDOCAINE HCL (PF) 1 % IJ SOLN: 30.0000 mL | INTRAMUSCULAR | Status: DC | PRN

## 2021-04-15 MED ORDER — ONDANSETRON HCL 4 MG/2ML IJ SOLN: 4.0000 mg | Freq: Four times a day (QID) | INTRAMUSCULAR | Status: DC | PRN

## 2021-04-15 MED ORDER — TERBUTALINE SULFATE 1 MG/ML IJ SOLN
0.2500 mg | Freq: Once | INTRAMUSCULAR | Status: DC | PRN
Start: 2021-04-15 — End: 2021-04-16

## 2021-04-15 MED ORDER — FENTANYL CITRATE (PF) 100 MCG/2ML IJ SOLN
100.0000 ug | INTRAMUSCULAR | Status: DC | PRN
Start: 2021-04-15 — End: 2021-04-15

## 2021-04-15 MED ORDER — LACTATED RINGERS IV SOLN: INTRAVENOUS | Status: DC

## 2021-04-15 MED ORDER — OXYTOCIN-SODIUM CHLORIDE 30-0.9 UT/500ML-% IV SOLN
2.5000 [IU]/h | INTRAVENOUS | Status: DC
  Administered 2021-04-16: 2.5 [IU]/h via INTRAVENOUS

## 2021-04-15 MED ORDER — FENTANYL CITRATE (PF) 100 MCG/2ML IJ SOLN
50.0000 ug | INTRAMUSCULAR | Status: DC | PRN
  Administered 2021-04-16: 100 ug via INTRAVENOUS
  Filled 2021-04-15: qty 2

## 2021-04-15 MED ORDER — SOD CITRATE-CITRIC ACID 500-334 MG/5ML PO SOLN: 30.0000 mL | ORAL | Status: DC | PRN

## 2021-04-15 MED ORDER — SODIUM CHLORIDE 0.9 % IV SOLN
5.0000 10*6.[IU] | Freq: Once | INTRAVENOUS | Status: AC
  Administered 2021-04-15: 5 10*6.[IU] via INTRAVENOUS
  Filled 2021-04-15: qty 5

## 2021-04-15 NOTE — MAU Note (Signed)
Pt used bathroom in lobby and did not collect a sample she will press call button when she can give another sample.

## 2021-04-15 NOTE — Progress Notes (Signed)
   PRENATAL VISIT NOTE  Subjective:  Brooke Patel is a 25 y.o. G4P3003 at [redacted]w[redacted]d being seen today for ongoing prenatal care.  She is currently monitored for the following issues for this high-risk pregnancy and has Supervision of low-risk pregnancy; Anemia; Moderate major depression (HCC); GAD (generalized anxiety disorder); Carrier of genetic disorder; Short interval between pregnancies affecting pregnancy, antepartum; and Group B Streptococcus carrier, +RV culture, currently pregnant on their problem list.  Patient reports no complaints.  Contractions: Irritability. Vag. Bleeding: None.  Movement: Present. Denies leaking of fluid.   The following portions of the patient's history were reviewed and updated as appropriate: allergies, current medications, past family history, past medical history, past social history, past surgical history and problem list.   Objective:   Vitals:   04/15/21 0906 04/15/21 0913  BP: (!) 145/94 (!) 134/92  Pulse: 89 76  Weight: 150 lb 8 oz (68.3 kg)     Fetal Status: Fetal Heart Rate (bpm): 139   Movement: Present     General:  Alert, oriented and cooperative. Patient is in no acute distress.  Skin: Skin is warm and dry. No rash noted.   Cardiovascular: Normal heart rate noted  Respiratory: Normal respiratory effort, no problems with respiration noted  Abdomen: Soft, gravid, appropriate for gestational age.  Pain/Pressure: Present     Pelvic: Cervical exam deferred        Extremities: Normal range of motion.  Edema: None  Mental Status: Normal mood and affect. Normal behavior. Normal judgment and thought content.   Assessment and Plan:  Pregnancy: G4P3003 at [redacted]w[redacted]d 1. Group B Streptococcus carrier, +RV culture, currently pregnant Prophylaxis in labor  2. Encounter for supervision of low-risk pregnancy in third trimester Elevated BP suspect GHTN, to MAU for possible admission  Term labor symptoms and general obstetric precautions including but not  limited to vaginal bleeding, contractions, leaking of fluid and fetal movement were reviewed in detail with the patient. Please refer to After Visit Summary for other counseling recommendations.   Return in about 1 week (around 04/22/2021) for BP check PP.  Future Appointments  Date Time Provider Department Center  04/22/2021  1:55 PM Anyanwu, Jethro Bastos, MD [facility]    Scheryl Darter, MD

## 2021-04-15 NOTE — H&P (Signed)
OBSTETRIC ADMISSION HISTORY AND PHYSICAL  Brooke Patel is a 25 y.o. female [phone] with IUP at 14w1dby LMP presenting for elevated BP in the office today ~0915. BP was 134/92 and 145/94.   Reports fetal movement. Denies vaginal bleeding.  [facility]  Support person in labor: spouse  Ultrasounds:  Narrative & Impression  ----------------------------------------------------------------------  OBSTETRICS REPORT                       (Signed Final 12/25/2020 02:36 pm) ---------------------------------------------------------------------- Patient Info  ID #:       [id number]                         D.O.B.:  [birth year](25 yrs)  Name:       Brooke Patel                 Visit Date: 12/25/2020 01:14 pm ---------------------------------------------------------------------- Performed By  Attending:        VJohnell ComingsMD         Ref. Address:     Faculty Practice  Performed By:     KNathen May      [facility]                                                             [facility]  Referred By:      TVirginia RochesterNP ---------------------------------------------------------------------- Orders  #  Description                           Code        Ordered By  1  UKoreaMFM OB FOLLOW UP                   [id number]   YPeterson Ao----------------------------------------------------------------------  #  Order #                     Accession #                Episode #  1  [id number]                  [id number]                [id number]----------------------------------------------------------------------  Indications  Fetal abnormality - other known or suspected   O35.9XX0  [redacted] weeks gestation of pregnancy                Z3A.23  Encounter for antenatal screening for           Z36.3  malformations  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 Cord Insertion:      Previously Visualized  Amniotic Fluid  AFI FV:      Within normal limits                              Largest Pocket(cm)                              5.85 ---------------------------------------------------------------------- Biometry  BPD:      58.4  mm     G. Age:  23w 6d         68  %    CI:        72.42   %    70 - 86                                                          FL/HC:      19.5   %    19.2 - 20.8  HC:      218.3  mm     G. Age:  23w 6d         58  %    HC/AC:      1.13        1.05 - 1.21  AC:      193.4  mm     G. Age:  24w 0d         66  %    FL/BPD:     72.8   %    71 - 87  FL:       42.5  mm     G. Age:  23w 6d         58  %    FL/AC:      22.0   %    20 - 24  HUM:      38.4  mm     G. Age:  23w 4d         48  %  CER:      26.7  mm     G. Age:  24w 0d         88  %  LV:        6.5  mm  Est. FW:     646  gm      1 lb 7 oz     75  % ---------------------------------------------------------------------- OB History  Gravidity:  4         Term:   3 ---------------------------------------------------------------------- Gestational Age  LMP:           23w 2d        Date:  07/15/20                 EDD:   04/21/21  U/S Today:     23w 6d                                        EDD:   04/17/21  Best:          23w 2d     Det. By:  LMP  (07/15/20)          EDD:   04/21/21 ---------------------------------------------------------------------- Anatomy  Cranium:               Appears normal         LVOT:                   Previously seen  Cavum:                 Appears normal         Aortic Arch:             Previously seen  Ventricles:            Appears normal         Ductal Arch:            Previously seen  Choroid Plexus:        Previously seen        Diaphragm:              Appears normal  Cerebellum:            Appears normal         Stomach:                Appears normal, left                                                                        sided  Posterior Fossa:       Previously seen        Abdomen:                Appears normal  Nuchal Fold:           Not applicable (>16    Abdominal Wall:         Previously seen                         wks GA)  Face:                  Orbits and profile     Cord Vessels:           Previously seen                         previously seen  Lips:  Previously seen        Kidneys:                Appear normal  Palate:                Appears normal         Bladder:                Appears normal  Thoracic:              Appears normal         Spine:                  Appears normal  Heart:                 Appears normal         Upper Extremities:      Previously seen                         (4CH, axis, and                         situs)  RVOT:                  Previously seen        Lower Extremities:      Previously seen  Other:  Female gender previously seen. Heels previously visualized. Nasal          bone previously visualized. ---------------------------------------------------------------------- Cervix Uterus Adnexa  Cervix  Length:           4.92  cm.  Normal appearance by transabdominal scan.  Uterus  No abnormality visualized.  Right Ovary  Within normal limits.  Left Ovary  Within normal limits.  Cul De Sac  No free fluid seen.  Adnexa  No adnexal mass visualized. ---------------------------------------------------------------------- Comments  This patient was seen for a follow up exam as the views of  the fetal anatomy were unable to be fully visualized during  her last exam.  She denies any problems since her last  exam.  She was informed that the fetal growth and amniotic fluid  level appears appropriate for her gestational age.  The views of the fetal anatomy were visualized today.  There  were no obvious anomalies noted.  The limitations of ultrasound in the detection of all anomalies  was discussed.  Follow-up as indicated. ----------------------------------------------------------------------                   Johnell Comings, MD Electronically Signed Final Report   12/25/2020 02:36 pm ----------------------------------------------------------------------   Prenatal History/Complications: gHTN (dx'd today) GAD Anxiety (+) GBS Anemia  OB BOX: Nursing Staff Provider  Office Location  [facility] Dating   LMP  Language  English Anatomy US   ordered  Flu Vaccine   declined 04-09-21 Genetic Screen  NIPS: low risk   AFP:   Screen negative   TDaP Vaccine   02/04/21 Hgb A1C or  GTT Early  Third trimester Normal  Glucose, Fasting 65 - 91 mg/dL 89   Glucose, 1 hour 65 - 179 mg/dL 174   Glucose, 2 hour 65 - 152 mg/dL 146    COVID Vaccine no   LAB RESULTS   Rhogam  n/a Blood Type A/Positive/-- (03/02 1455)   Feeding Plan Bottle Antibody Comment, See Final Results (03/02 1455)  Contraception IUD Rubella 1.32 (03/02 1455) IMMUNE  Circumcision  girl  RPR Non Reactive (03/02 1455)   Pediatrician  [facility] HBsAg Negative (03/02 1455)   Support Person Cory Roughen (FOB) HCVAb  Negative 09-24-20  Prenatal Classes  HIV Non Reactive (03/02 1455)     BTL Consent NA GBS  Positive (For PCN allergy, check sensitivities)   VBAC Consent NA Pap Needs pap PP    Hgb Electro    BP Cuff Has from last preg CF     SMA     Waterbirth  [ ] Class [ ] Consent [ ] CNM visit    Induction  [ ] Orders Entered [ ]Foley Y/N   Past Medical History: Past Medical History:  Diagnosis Date   Anemia     Past Surgical History: Past Surgical History:  Procedure Laterality Date   NO PAST SURGERIES      Obstetrical  History: OB History     Gravida  4   Para  3   Term  3   Preterm  0   AB      Living  3      SAB  0   IAB  0   Ectopic  0   Multiple  0   Live Births  3           Social History: Social History   Socioeconomic History   Marital status: Single    Spouse name: Not on file   Number of children: Not on file   Years of education: 10   Highest education level: Not on file  Occupational History   Not on file  Tobacco Use   Smoking status: Never   Smokeless tobacco: Never  Vaping Use   Vaping Use: Never used  Substance and Sexual Activity   Alcohol use: No   Drug use: No   Sexual activity: Not Currently    Birth control/protection: None  Other Topics Concern   Not on file  Social History Narrative   Not on file   Social Determinants of Health   Financial Resource Strain: Not on file  Food Insecurity: No Food Insecurity   Worried About Running Out of Food in the Last Year: Never true   Ran Out of Food in the Last Year: Never true  Transportation Needs: No Transportation Needs   Lack of Transportation (Medical): No   Lack of Transportation (Non-Medical): No  Physical Activity: Not on file  Stress: Not on file  Social Connections: Not on file    Family History: Family History  Problem Relation Age of Onset   Cancer Sister    Diabetes Paternal Grandmother    Cancer Paternal Grandmother     Allergies: No Known Allergies  Medications Prior to Admission  Medication Sig Dispense Refill Last Dose   Prenatal Vit-Fe Fumarate-FA (PRENATAL VITAMIN) 27-0.8 MG TABS Take 1 tablet by mouth daily. 30 tablet 11 04/14/2021   acetaminophen (TYLENOL) 325 MG tablet Take 650 mg by mouth every 6 (six) hours as needed. (Patient not taking: No sig reported)      Blood Pressure Monitoring (BLOOD PRESSURE KIT) DEVI 1 Device by Does not apply route as needed. ICD 10 Z34.90 (Patient not taking: No sig reported) 1 Device 0    iron polysaccharides (NIFEREX) 150 MG  capsule Take 1 capsule (150 mg total) by mouth daily. (Patient not taking: No sig reported) 30 capsule 2    omeprazole (PRILOSEC) 40 MG capsule Take 1 capsule (40 mg total) by mouth daily. (Patient not taking: No  sig reported) 30 capsule 2      Review of Systems  All systems reviewed and negative except as stated in HPI  Patient Vitals for the past 24 hrs:  BP Temp Pulse Resp SpO2  04/15/21 1530 130/87 -- 88 -- 100 %  04/15/21 1500 (!) 130/92 -- 94 -- 100 %  04/15/21 1445 124/85 -- 87 -- 99 %  04/15/21 1430 128/88 -- 86 -- 100 %  04/15/21 1415 128/83 -- 96 -- 100 %  04/15/21 1405 135/90 -- 100 -- 100 %  04/15/21 1404 -- 98.3 F (36.8 C) -- 20 100 %    General appearance: alert, cooperative, and no distress Lungs: no respiratory distress Heart: regular rate  Abdomen: soft, non-tender; gravid Pelvic: adequate Extremities: Homans sign is negative, no sign of DVT Presentation: cephalic Fetal monitoring: 150 bpm / moderate variability / accels present / decels absent  Uterine activity: irregular  Prenatal labs: ABO, Rh: A/Positive/-- (03/02 1455) Antibody: Comment, See Final Results (03/02 1455) Rubella: 1.32 (03/02 1455) RPR: Non Reactive (07/13 0835)  HBsAg: Negative (03/02 1455)  HIV: Non Reactive (07/13 0835)  GBS: Positive/-- (09/06 1625)  Glucola: Normal [phone] Genetic screening:  Low Risk Panorama  Prenatal Transfer Tool  Maternal Diabetes: No Genetic Screening: Normal Maternal Ultrasounds/Referrals: Normal Fetal Ultrasounds or other Referrals:  None Maternal Substance Abuse:  No Significant Maternal Medications:  None Significant Maternal Lab Results: Group B Strep positive  Results for orders placed or performed during the hospital encounter of 04/15/21 (from the past 24 hour(s))  CBC   Collection Time: 04/15/21  2:39 PM  Result Value Ref Range   WBC 8.4 4.0 - 10.5 K/uL   RBC 3.79 (L) 3.87 - 5.11 MIL/uL   Hemoglobin 10.5 (L) 12.0 - 15.0 g/dL   HCT 32.1  (L) 36.0 - 46.0 %   MCV 84.7 80.0 - 100.0 fL   MCH 27.7 26.0 - 34.0 pg   MCHC 32.7 30.0 - 36.0 g/dL   RDW 16.4 (H) 11.5 - 15.5 %   Platelets 157 150 - 400 K/uL   nRBC 0.2 0.0 - 0.2 %    Patient Active Problem List   Diagnosis Date Noted   Gestational hypertension 04/15/2021   Group B Streptococcus carrier, +RV culture, currently pregnant 04/03/2021   Short interval between pregnancies affecting pregnancy, antepartum 09/24/2020   Carrier of genetic disorder 05/11/2019   Supervision of low-risk pregnancy 04/09/2019   Anemia    Moderate major depression ([place]) 02/17/2018   GAD (generalized anxiety disorder) 02/17/2018    Assessment/Plan:  Yakima Kreitzer is a 25 y.o. G4P3003 at 73w1dhere for IOL due to newly dx'd gHTN.  Labor: IOL -- pain control: none  Fetal Wellbeing: EFW 7.5 lbs by Leopold's. Cephalic by SVE.  -- GBS (+) -- continuous fetal monitoring - category 1   Postpartum Planning -- bottle -- Tdap done 02/04/2021   RLaury Deep CNM  04/15/2021, 3:28 PM

## 2021-04-15 NOTE — Progress Notes (Signed)
LABOR PROGRESS NOTE  Brooke Patel is a 25 y.o. G4P3003 at [redacted]w[redacted]d  admitted for IOL for gHTN.  Subjective: Doing well bouncing on ball when I enter the room. No concerns at this time/  Objective: BP (!) 142/91   Pulse 81   Temp 98.8 F (37.1 C)   Resp 14   Ht 5\' 3"  (1.6 m)   Wt 68 kg   LMP 07/15/2020   SpO2 99%   BMI 26.57 kg/m  or  Vitals:   04/15/21 2010 04/15/21 2132 04/15/21 2212 04/15/21 2304  BP: (!) 139/94 135/88 138/85 (!) 142/91  Pulse: 66 69 75 81  Resp:    14  Temp:    98.8 F (37.1 C)  TempSrc:      SpO2:      Weight:      Height:       Dilation: 5.5 Effacement (%): 80, 90 Cervical Position: Middle Station: -1 Presentation: Vertex Exam by:: Dr [email or web address] FHT: baseline rate 145, moderate varibility, +acel, nodecel Toco: every 2-3 min   Labs: Lab Results  Component Value Date   WBC 8.4 04/15/2021   HGB 10.5 (L) 04/15/2021   HCT 32.1 (L) 04/15/2021   MCV 84.7 04/15/2021   PLT 157 04/15/2021    Patient Active Problem List   Diagnosis Date Noted   Gestational hypertension 04/15/2021   Indication for care in labor and delivery, antepartum 04/15/2021   Group B Streptococcus carrier, +RV culture, currently pregnant 04/03/2021   Short interval between pregnancies affecting pregnancy, antepartum 09/24/2020   Carrier of genetic disorder 05/11/2019   Supervision of low-risk pregnancy 04/09/2019   Anemia    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   GAD (generalized anxiety disorder) 02/17/2018    Assessment / Plan: 24 y.o. G4P3003 at [redacted]w[redacted]d here for IOL for gHTN   Labor: Progressing well on pitocin at this time. AROM at this evaluation. Will continue to monitor and titrate pitocin as able.  Fetal Wellbeing:  Cat 1, reassuring  Pain Control:  None  Anticipated MOD:  vaginal  gHTN: Most recent BP 142/91. No severe range BP noted. Pre=E labs negative. Will continue to monitor,.  GBS+: s/p 2 doses of PCN   [redacted]w[redacted]d, MD  PGY-2, [facility]  04/15/2021, 11:53 PM

## 2021-04-15 NOTE — MAU Note (Signed)
Pt reports she was in the office today and her blood pressure was high and was told to come to MAU for blood pressure evaluation.   Denies LOF. Denies vaginal bleeding.   Reports+FM

## 2021-04-15 NOTE — H&P (Deleted)
OBSTETRIC ADMISSION HISTORY AND PHYSICAL  Brooke Patel is a 25 y.o. female [phone] with IUP at 84w1dby LMP presenting for IOL for preeclampsia without severe features. She reports +FMs, No LOF, no VB, no blurry vision, headaches or peripheral edema, and RUQ pain.  She plans on bottle feeding. She is undecided for birth control. [facility]  Dating: By LMP --->  Estimated Date of Delivery: 04/21/21  Sono:    '@[redacted]w[redacted]d' , CWD, normal anatomy, vertex presentation,  75% EFW   Prenatal History/Complications: term SVD x 3  Past Medical History: Past Medical History:  Diagnosis Date   Anemia     Past Surgical History: Past Surgical History:  Procedure Laterality Date   NO PAST SURGERIES      Obstetrical History: OB History     Gravida  4   Para  3   Term  3   Preterm  0   AB      Living  3      SAB  0   IAB  0   Ectopic  0   Multiple  0   Live Births  3           Social History Social History   Socioeconomic History   Marital status: Single    Spouse name: Not on file   Number of children: Not on file   Years of education: 10   Highest education level: Not on file  Occupational History   Not on file  Tobacco Use   Smoking status: Never   Smokeless tobacco: Never  Vaping Use   Vaping Use: Never used  Substance and Sexual Activity   Alcohol use: No   Drug use: No   Sexual activity: Not Currently    Birth control/protection: None  Other Topics Concern   Not on file  Social History Narrative   Not on file   Social Determinants of Health   Financial Resource Strain: Not on file  Food Insecurity: No Food Insecurity   Worried About Running Out of Food in the Last Year: Never true   Ran Out of Food in the Last Year: Never true  Transportation Needs: No Transportation Needs   Lack of Transportation (Medical): No   Lack of Transportation (Non-Medical): No  Physical Activity: Not on file  Stress: Not on file  Social  Connections: Not on file    Family History: Family History  Problem Relation Age of Onset   Cancer Sister    Diabetes Paternal Grandmother    Cancer Paternal Grandmother     Allergies: No Known Allergies  Medications Prior to Admission  Medication Sig Dispense Refill Last Dose   Prenatal Vit-Fe Fumarate-FA (PRENATAL VITAMIN) 27-0.8 MG TABS Take 1 tablet by mouth daily. 30 tablet 11 04/14/2021 at 2100   Blood Pressure Monitoring (BLOOD PRESSURE KIT) DEVI 1 Device by Does not apply route as needed. ICD 10 Z34.90 (Patient not taking: No sig reported) 1 Device 0    iron polysaccharides (NIFEREX) 150 MG capsule Take 1 capsule (150 mg total) by mouth daily. (Patient not taking: No sig reported) 30 capsule 2    omeprazole (PRILOSEC) 40 MG capsule Take 1 capsule (40 mg total) by mouth daily. (Patient not taking: No sig reported) 30 capsule 2      Review of Systems   All systems reviewed and negative except as stated in HPI  Blood pressure 133/84, pulse 75, temperature 98.1 F (36.7 C), temperature source  Oral, resp. rate 20, height '5\' 3"'  (1.6 m), weight 68 kg, last menstrual period 07/15/2020, SpO2 99 %, unknown if currently breastfeeding. General appearance: alert, cooperative, and no distress Lungs: clear to auscultation bilaterally Heart: regular rate and rhythm Abdomen: soft, non-tender; bowel sounds normal Pelvic: n/a Extremities: Homans sign is negative, no sign of DVT DTR's wnl Presentation: cephalic Fetal monitoringBaseline: 135 bpm Uterine activityFrequency: Every 2-10 minutes Dilation: 3 Effacement (%): 60 Station: -3 Exam by:: Opal Sidles, CNM   Prenatal labs: ABO, Rh: --/--/A POS (09/21 1440) Antibody: NEG (09/21 1440) Rubella: 1.32 (03/02 1455) RPR: Non Reactive (07/13 0835)  HBsAg: Negative (03/02 1455)  HIV: Non Reactive (07/13 0835)  GBS: Positive/-- (09/06 1625)  1 hr Glucola 2 hour wnl, 89, 174, 146 Genetic screening  low risk NIPS Anatomy US  wnl  Prenatal Transfer Tool  Maternal Diabetes: No Genetic Screening: Normal Maternal Ultrasounds/Referrals: Normal Fetal Ultrasounds or other Referrals:  None Maternal Substance Abuse:  No Significant Maternal Medications:  None Significant Maternal Lab Results: Group B Strep positive  Results for orders placed or performed during the hospital encounter of 04/15/21 (from the past 24 hour(s))  CBC   Collection Time: 04/15/21  2:39 PM  Result Value Ref Range   WBC 8.4 4.0 - 10.5 K/uL   RBC 3.79 (L) 3.87 - 5.11 MIL/uL   Hemoglobin 10.5 (L) 12.0 - 15.0 g/dL   HCT 32.1 (L) 36.0 - 46.0 %   MCV 84.7 80.0 - 100.0 fL   MCH 27.7 26.0 - 34.0 pg   MCHC 32.7 30.0 - 36.0 g/dL   RDW 16.4 (H) 11.5 - 15.5 %   Platelets 157 150 - 400 K/uL   nRBC 0.2 0.0 - 0.2 %  Comprehensive metabolic panel   Collection Time: 04/15/21  2:39 PM  Result Value Ref Range   Sodium 134 (L) 135 - 145 mmol/L   Potassium 3.9 3.5 - 5.1 mmol/L   Chloride 101 98 - 111 mmol/L   CO2 22 22 - 32 mmol/L   Glucose, Bld 75 70 - 99 mg/dL   BUN 9 6 - 20 mg/dL   Creatinine, Ser 0.85 0.44 - 1.00 mg/dL   Calcium 8.7 (L) 8.9 - 10.3 mg/dL   Total Protein 6.2 (L) 6.5 - 8.1 g/dL   Albumin 2.8 (L) 3.5 - 5.0 g/dL   AST 25 15 - 41 U/L   ALT 13 0 - 44 U/L   Alkaline Phosphatase 159 (H) 38 - 126 U/L   Total Bilirubin 0.7 0.3 - 1.2 mg/dL   GFR, Estimated >60 >60 mL/min   Anion gap 11 5 - 15  Type and screen [place]   Collection Time: 04/15/21  2:40 PM  Result Value Ref Range   ABO/RH(D) A POS    Antibody Screen NEG    Sample Expiration      04/18/2021,2359 Performed at [facility], [address] [place], [place] [address]   Protein / creatinine ratio, urine   Collection Time: 04/15/21  3:18 PM  Result Value Ref Range   Creatinine, Urine 84.63 mg/dL   Total Protein, Urine 8 mg/dL   Protein Creatinine Ratio 0.09 0.00 - 0.15 mg/mg[Cre]  Resp Panel by RT-PCR (Flu A&B, Covid) Nasopharyngeal Swab    Collection Time: 04/15/21  3:41 PM   Specimen: Nasopharyngeal Swab; Nasopharyngeal(NP) swabs in vial transport medium  Result Value Ref Range   SARS Coronavirus 2 by RT PCR NEGATIVE NEGATIVE   Influenza A by  PCR NEGATIVE NEGATIVE   Influenza B by PCR NEGATIVE NEGATIVE    Patient Active Problem List   Diagnosis Date Noted   Gestational hypertension 04/15/2021   Indication for care in labor and delivery, antepartum 04/15/2021   Group B Streptococcus carrier, +RV culture, currently pregnant 04/03/2021   Short interval between pregnancies affecting pregnancy, antepartum 09/24/2020   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 G4P3003 at 61w1dhere for IOL for preeclampsia without severe features  #Labor:IOL with Pitocin given appropriate Bishop score #Pain: Plans IV pain medication, epidural as needed #FWB: Category I #ID:  GBS positive, start Penicillin #MOF: bottle #MOC:initially desired IUD but is now undecided on admission #Circ:  [place] CNM  04/15/2021, 5:12 PM

## 2021-04-16 ENCOUNTER — Encounter (HOSPITAL_COMMUNITY): Payer: Self-pay | Admitting: Obstetrics and Gynecology

## 2021-04-16 DIAGNOSIS — O134 Gestational [pregnancy-induced] hypertension without significant proteinuria, complicating childbirth: Secondary | ICD-10-CM

## 2021-04-16 DIAGNOSIS — O99820 Streptococcus B carrier state complicating pregnancy: Secondary | ICD-10-CM

## 2021-04-16 DIAGNOSIS — Z3A39 39 weeks gestation of pregnancy: Secondary | ICD-10-CM

## 2021-04-16 LAB — CBC
HCT: 30.5 % — ABNORMAL LOW (ref 36.0–46.0)
Hemoglobin: 10.2 g/dL — ABNORMAL LOW (ref 12.0–15.0)
MCH: 28.3 pg (ref 26.0–34.0)
MCHC: 33.4 g/dL (ref 30.0–36.0)
MCV: 84.5 fL (ref 80.0–100.0)
Platelets: 138 10*3/uL — ABNORMAL LOW (ref 150–400)
RBC: 3.61 MIL/uL — ABNORMAL LOW (ref 3.87–5.11)
RDW: 16.6 % — ABNORMAL HIGH (ref 11.5–15.5)
WBC: 13.6 10*3/uL — ABNORMAL HIGH (ref 4.0–10.5)
nRBC: 0 % (ref 0.0–0.2)

## 2021-04-16 LAB — RPR: RPR Ser Ql: NONREACTIVE

## 2021-04-16 MED ORDER — MEDROXYPROGESTERONE ACETATE 150 MG/ML IM SUSP: 150.0000 mg | Freq: Once | INTRAMUSCULAR | Status: DC

## 2021-04-16 MED ORDER — TETANUS-DIPHTH-ACELL PERTUSSIS 5-2.5-18.5 LF-MCG/0.5 IM SUSY: 0.5000 mL | PREFILLED_SYRINGE | Freq: Once | INTRAMUSCULAR | Status: DC

## 2021-04-16 MED ORDER — SENNOSIDES-DOCUSATE SODIUM 8.6-50 MG PO TABS
2.0000 | ORAL_TABLET | ORAL | Status: DC
  Administered 2021-04-16 – 2021-04-17 (×2): 2 via ORAL
  Filled 2021-04-16 (×2): qty 2

## 2021-04-16 MED ORDER — ACETAMINOPHEN 325 MG PO TABS
650.0000 mg | ORAL_TABLET | ORAL | Status: DC | PRN
  Administered 2021-04-16: 650 mg via ORAL
  Filled 2021-04-16 (×2): qty 2

## 2021-04-16 MED ORDER — ZOLPIDEM TARTRATE 5 MG PO TABS: 5.0000 mg | ORAL_TABLET | Freq: Every evening | ORAL | Status: DC | PRN

## 2021-04-16 MED ORDER — ONDANSETRON HCL 4 MG PO TABS: 4.0000 mg | ORAL_TABLET | ORAL | Status: DC | PRN

## 2021-04-16 MED ORDER — WITCH HAZEL-GLYCERIN EX PADS: 1.0000 "application " | MEDICATED_PAD | CUTANEOUS | Status: DC | PRN

## 2021-04-16 MED ORDER — SIMETHICONE 80 MG PO CHEW: 80.0000 mg | CHEWABLE_TABLET | ORAL | Status: DC | PRN

## 2021-04-16 MED ORDER — BENZOCAINE-MENTHOL 20-0.5 % EX AERO: 1.0000 "application " | INHALATION_SPRAY | CUTANEOUS | Status: DC | PRN

## 2021-04-16 MED ORDER — ONDANSETRON HCL 4 MG/2ML IJ SOLN: 4.0000 mg | INTRAMUSCULAR | Status: DC | PRN

## 2021-04-16 MED ORDER — DIBUCAINE (PERIANAL) 1 % EX OINT: 1.0000 "application " | TOPICAL_OINTMENT | CUTANEOUS | Status: DC | PRN

## 2021-04-16 MED ORDER — PRENATAL MULTIVITAMIN CH
1.0000 | ORAL_TABLET | Freq: Every day | ORAL | Status: DC
  Administered 2021-04-16: 1 via ORAL
  Filled 2021-04-16: qty 1

## 2021-04-16 MED ORDER — IBUPROFEN 600 MG PO TABS
600.0000 mg | ORAL_TABLET | Freq: Four times a day (QID) | ORAL | Status: DC
  Administered 2021-04-16 – 2021-04-17 (×5): 600 mg via ORAL
  Filled 2021-04-16 (×6): qty 1

## 2021-04-16 MED ORDER — COCONUT OIL OIL: 1.0000 "application " | TOPICAL_OIL | Status: DC | PRN

## 2021-04-16 MED ORDER — DIPHENHYDRAMINE HCL 25 MG PO CAPS: 25.0000 mg | ORAL_CAPSULE | Freq: Four times a day (QID) | ORAL | Status: DC | PRN

## 2021-04-16 NOTE — Discharge Summary (Signed)
Postpartum Discharge Summary     Patient Name: Brooke Patel DOB: 1995/10/04 MRN: [id number]  Date of admission: 04/15/2021 Delivery date:04/16/2021  Delivering provider: Gifford Shave  Date of discharge: 04/17/2021  Admitting diagnosis: Indication for care in labor and delivery, antepartum [O75.9] Intrauterine pregnancy: [redacted]w[redacted]d    Secondary diagnosis:  Principal Problem:   Vaginal delivery Active Problems:   Short interval between pregnancies affecting pregnancy, antepartum   Group B Streptococcus carrier, +RV culture, currently pregnant   Gestational hypertension  Additional problems: None    Discharge diagnosis: Term Pregnancy Delivered and Gestational Hypertension                                              Post partum procedures: None Augmentation: AROM and Pitocin Complications: None  Hospital course: Induction of Labor With Vaginal Delivery   25y.o. yo [phone] 365w2das admitted to the hospital 04/15/2021 for induction of labor.  Indication for induction: Gestational hypertension; she had an elevated BP at her office visit and was sent for IOL. Pre-e labs were negative and she remained asymptomatic with mild range elevations.  Patient had an uncomplicated labor course as follows: Membrane Rupture Time/Date: 11:00 PM ,04/15/2021   Delivery Method:Vaginal, Spontaneous  Episiotomy: None  Lacerations:  None  Details of delivery can be found in separate delivery note.  Patient had a routine postpartum course. Blood pressures remained within normal limits postpartum. She will have a blood pressure check in 1 week. Patient is discharged home 04/17/21.  Newborn Data: Birth date:04/16/2021  Birth time:12:31 AM  Gender:Female  Living status:Living  Apgars:9 ,9  Weight:3436 g (7lb 9.2oz)  Magnesium Sulfate received: No BMZ received: No Rhophylac: N/A MMR: N/A T-DaP: Given prenatally Flu: No Transfusion: No  Physical exam  Vitals:   04/16/21 1330 04/16/21 1633  04/16/21 1948 04/17/21 0539  BP: 114/76 122/81 126/83 117/83  Pulse: 75 68 78 75  Resp:  '16 18 16  ' Temp:  98 F (36.7 C) 98.4 F (36.9 C) 98.4 F (36.9 C)  TempSrc:  Oral Oral Oral  SpO2:  99% 99% 99%  Weight:      Height:       General: alert, cooperative, and no distress Lochia: appropriate Uterine Fundus: firm DVT Evaluation: no LE edema or calf tenderness to palpation  Labs: Lab Results  Component Value Date   WBC 13.6 (H) 04/16/2021   HGB 10.2 (L) 04/16/2021   HCT 30.5 (L) 04/16/2021   MCV 84.5 04/16/2021   PLT 138 (L) 04/16/2021   CMP Latest Ref Rng & Units 04/15/2021  Glucose 70 - 99 mg/dL 75  BUN 6 - 20 mg/dL 9  Creatinine 0.44 - 1.00 mg/dL 0.85  Sodium 135 - 145 mmol/L 134(L)  Potassium 3.5 - 5.1 mmol/L 3.9  Chloride 98 - 111 mmol/L 101  CO2 22 - 32 mmol/L 22  Calcium 8.9 - 10.3 mg/dL 8.7(L)  Total Protein 6.5 - 8.1 g/dL 6.2(L)  Total Bilirubin 0.3 - 1.2 mg/dL 0.7  Alkaline Phos 38 - 126 U/L 159(H)  AST 15 - 41 U/L 25  ALT 0 - 44 U/L 13   Edinburgh Score: Edinburgh Postnatal Depression Scale Screening Tool 04/17/2021  I have been able to laugh and see the funny side of things. 0  I have looked forward with enjoyment to things. 0  I have  blamed myself unnecessarily when things went wrong. 0  I have been anxious or worried for no good reason. 0  I have felt scared or panicky for no good reason. 0  Things have been getting on top of me. 0  I have been so unhappy that I have had difficulty sleeping. 0  I have felt sad or miserable. 0  I have been so unhappy that I have been crying. 0  The thought of harming myself has occurred to me. 0  Edinburgh Postnatal Depression Scale Total 0     After visit meds:  Allergies as of 04/17/2021   No Known Allergies      Medication List     STOP taking these medications    iron polysaccharides 150 MG capsule Commonly known as: NIFEREX   omeprazole 40 MG capsule Commonly known as: PRILOSEC       TAKE  these medications    acetaminophen 500 MG tablet Commonly known as: TYLENOL Take 2 tablets (1,000 mg total) by mouth every 8 (eight) hours as needed (pain).   Blood Pressure Kit Devi 1 Device by Does not apply route as needed. ICD 10 Z34.90   ibuprofen 600 MG tablet Commonly known as: ADVIL Take 1 tablet (600 mg total) by mouth every 6 (six) hours as needed (pain).   Prenatal Vitamin 27-0.8 MG Tabs Take 1 tablet by mouth daily.         Discharge home in stable condition Infant Feeding: Bottle Infant Disposition: home with mother Discharge instruction: per After Visit Summary and Postpartum booklet. Activity: Advance as tolerated. Pelvic rest for 6 weeks.  Diet: routine diet Future Appointments: Future Appointments  Date Time Provider Alvan  04/22/2021  1:55 PM Anyanwu, Sallyanne Havers, MD [facility]   Follow up Visit:  Myrtis Ser, CNM  P [facility] Admin Pool Please schedule this patient for Postpartum visit in: 4 weeks with the following provider: Any provider  In-Person  For C/S patients schedule nurse incision check in weeks 2 weeks: no  High risk pregnancy complicated by: gHTN  Delivery mode:  SVD  Anticipated Birth Control:  Depo bridge then IUD postpartum  PP Procedures needed: BP check in 1wk; Pap at [facility] visit  Schedule Integrated [place] visit: no   04/17/2021 Genia Del, MD

## 2021-04-16 NOTE — Plan of Care (Signed)
  Problem: Education: Goal: Knowledge of Childbirth will improve Outcome: Completed/Met Goal: Ability to make informed decisions regarding treatment and plan of care will improve Outcome: Completed/Met Goal: Ability to state and carry out methods to decrease the pain will improve Outcome: Completed/Met Goal: Individualized Educational Video(s) Outcome: Completed/Met   Problem: Coping: Goal: Ability to verbalize concerns and feelings about labor and delivery will improve Outcome: Completed/Met   Problem: Life Cycle: Goal: Ability to make normal progression through stages of labor will improve Outcome: Completed/Met Goal: Ability to effectively push during vaginal delivery will improve Outcome: Completed/Met   Problem: Role Relationship: Goal: Will demonstrate positive interactions with the child Outcome: Completed/Met   Problem: Safety: Goal: Risk of complications during labor and delivery will decrease Outcome: Completed/Met   Problem: Pain Management: Goal: Relief or control of pain from uterine contractions will improve Outcome: Completed/Met   Problem: Pain Managment: Goal: General experience of comfort will improve Outcome: Completed/Met   Problem: Safety: Goal: Ability to remain free from injury will improve Outcome: Completed/Met

## 2021-04-17 MED ORDER — ACETAMINOPHEN 500 MG PO TABS: 1000.0000 mg | ORAL_TABLET | Freq: Three times a day (TID) | ORAL | 0 refills | Status: DC | PRN

## 2021-04-17 MED ORDER — IBUPROFEN 600 MG PO TABS: 600.0000 mg | ORAL_TABLET | Freq: Four times a day (QID) | ORAL | 0 refills | Status: DC | PRN

## 2021-04-22 ENCOUNTER — Observation Stay (HOSPITAL_COMMUNITY)
Admission: EM | Admit: 2021-04-22 | Discharge: 2021-04-24 | Disposition: A | Discharge status: Home / Self Care | Payer: Medicaid Other | Attending: Emergency Medicine | Admitting: Emergency Medicine

## 2021-04-22 ENCOUNTER — Encounter (HOSPITAL_COMMUNITY): Payer: Self-pay

## 2021-04-22 ENCOUNTER — Encounter: Payer: Self-pay | Admitting: Obstetrics & Gynecology

## 2021-04-22 ENCOUNTER — Emergency Department (HOSPITAL_COMMUNITY): Payer: Medicaid Other

## 2021-04-22 ENCOUNTER — Other Ambulatory Visit: Payer: Self-pay

## 2021-04-22 DIAGNOSIS — O1495 Unspecified pre-eclampsia, complicating the puerperium: Secondary | ICD-10-CM | POA: Diagnosis present

## 2021-04-22 DIAGNOSIS — Z3A39 39 weeks gestation of pregnancy: Secondary | ICD-10-CM | POA: Insufficient documentation

## 2021-04-22 DIAGNOSIS — Z20822 Contact with and (suspected) exposure to covid-19: Secondary | ICD-10-CM | POA: Diagnosis not present

## 2021-04-22 LAB — CBC WITH DIFFERENTIAL/PLATELET
Abs Immature Granulocytes: 0.04 10*3/uL (ref 0.00–0.07)
Basophils Absolute: 0 10*3/uL (ref 0.0–0.1)
Basophils Relative: 1 %
Eosinophils Absolute: 0.2 10*3/uL (ref 0.0–0.5)
Eosinophils Relative: 2 %
HCT: 35.4 % — ABNORMAL LOW (ref 36.0–46.0)
Hemoglobin: 11.3 g/dL — ABNORMAL LOW (ref 12.0–15.0)
Immature Granulocytes: 1 %
Lymphocytes Relative: 25 %
Lymphs Abs: 2.1 10*3/uL (ref 0.7–4.0)
MCH: 27.9 pg (ref 26.0–34.0)
MCHC: 31.9 g/dL (ref 30.0–36.0)
MCV: 87.4 fL (ref 80.0–100.0)
Monocytes Absolute: 0.5 10*3/uL (ref 0.1–1.0)
Monocytes Relative: 6 %
Neutro Abs: 5.8 10*3/uL (ref 1.7–7.7)
Neutrophils Relative %: 65 %
Platelets: 211 10*3/uL (ref 150–400)
RBC: 4.05 MIL/uL (ref 3.87–5.11)
RDW: 17.5 % — ABNORMAL HIGH (ref 11.5–15.5)
WBC: 8.6 10*3/uL (ref 4.0–10.5)
nRBC: 0 % (ref 0.0–0.2)

## 2021-04-22 LAB — URINALYSIS, ROUTINE W REFLEX MICROSCOPIC
Bilirubin Urine: NEGATIVE
Glucose, UA: NEGATIVE mg/dL
Ketones, ur: NEGATIVE mg/dL
Nitrite: NEGATIVE
Protein, ur: NEGATIVE mg/dL
Specific Gravity, Urine: <1.005 — ABNORMAL LOW (ref 1.005–1.030)
pH: 6 (ref 5.0–8.0)

## 2021-04-22 LAB — COMPREHENSIVE METABOLIC PANEL
ALT: 142 U/L — ABNORMAL HIGH (ref 0–44)
AST: 248 U/L — ABNORMAL HIGH (ref 15–41)
Albumin: 3.3 g/dL — ABNORMAL LOW (ref 3.5–5.0)
Alkaline Phosphatase: 99 U/L (ref 38–126)
Anion gap: 12 (ref 5–15)
BUN: 18 mg/dL (ref 6–20)
CO2: 23 mmol/L (ref 22–32)
Calcium: 9.1 mg/dL (ref 8.9–10.3)
Chloride: 103 mmol/L (ref 98–111)
Creatinine, Ser: 0.74 mg/dL (ref 0.44–1.00)
GFR, Estimated: >60 mL/min (ref >60)
Glucose, Bld: 92 mg/dL (ref 70–99)
Potassium: 3.8 mmol/L (ref 3.5–5.1)
Sodium: 138 mmol/L (ref 135–145)
Total Bilirubin: 0.5 mg/dL (ref 0.3–1.2)
Total Protein: 6.9 g/dL (ref 6.5–8.1)

## 2021-04-22 NOTE — ED Provider Notes (Signed)
[facility] DEPT Provider Note   CSN: [id number] Arrival date & time: 04/22/21  2221     History No chief complaint on file.   Brooke Patel is a 25 y.o. 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  The patient reports that she developed a pressure-like headache at the top of her head last night.  Headache gradually improved prior to going to bed, but did not resolve.  Headache increased in intensity today.  She did briefly note some pain over the left temple, but otherwise headache has been nonradiating and is remained at the top of her head.  She was concerned that her blood pressure may have also been elevated when the headache started, but she did not have a blood pressure cuff to check it last night.  She obtained a cuff tonight and checked her blood pressure, which was 144/103, approximately 1 hour prior to arrival.  Just prior to coming to the emergency department, the patient reports that she developed dizziness, which she described as room spinning, that lasted for several minutes before resolving.  Dizziness was accompanied by nausea and gait instability.  She reports that all of the symptoms have resolved except for her headache.  She took Tylenol for her headache earlier today without improvement.  Three days ago, the patient reported tactile fever for 24 hours, but this has since resolved.  She denies visual changes, numbness, weakness, chest pain, shortness of breath, vomiting, diarrhea, dysuria, hematuria, URI symptoms, leg swelling, palpitations, neck pain or stiffness.  Patient reports that she is not breast-feeding her infant.  The history is provided by the patient and medical records. No language interpreter was used.      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   GAD (generalized anxiety disorder) 02/17/2018    Past Surgical History:  Procedure Laterality Date   NO PAST SURGERIES       OB History     Gravida  4   Para  4   Term  4   Preterm  0   AB      Living  4      SAB  0   IAB  0   Ectopic  0   Multiple  0   Live Births  4           Family History  Problem Relation Age of Onset   Cancer Sister    Diabetes Paternal Grandmother    Cancer Paternal Grandmother     Social History   Tobacco Use   Smoking status: Never   Smokeless tobacco: Never  Vaping Use   Vaping Use: Never used  Substance Use Topics   Alcohol use: No   Drug use: No    Home Medications Prior to Admission medications   Medication Sig Start Date End Date Taking? Authorizing Provider  acetaminophen (TYLENOL) 500 MG tablet Take 2 tablets (1,000 mg total) by mouth every 8 (eight) hours as needed (pain). Patient  taking differently: Take 500 mg by mouth every 8 (eight) hours as needed (for pain). 04/17/21  Yes Genia Del, MD  ibuprofen (ADVIL) 600 MG tablet Take 1 tablet (600 mg total) by mouth every 6 (six) hours as needed (pain). 04/17/21  Yes Genia Del, MD  Blood Pressure Monitoring (BLOOD PRESSURE KIT) DEVI 1 Device by Does not apply route as needed. ICD 10 Z34.90 Patient not taking: No sig reported 04/09/19   Virginia Rochester, NP  Prenatal Vit-Fe Fumarate-FA (PRENATAL VITAMIN) 27-0.8 MG TABS Take 1 tablet by mouth daily. Patient not taking: Reported on 04/22/2021 12/15/20   Virginia Rochester, NP    Allergies    Patient has no known  allergies.  Review of Systems   Review of Systems  Constitutional:  Negative for activity change, chills, diaphoresis, fatigue and fever.  HENT:  Negative for congestion and sore throat.   Eyes:  Negative for photophobia and visual disturbance.  Respiratory:  Negative for cough, shortness of breath and wheezing.   Cardiovascular:  Negative for chest pain, palpitations and leg swelling.  Gastrointestinal:  Positive for nausea. Negative for abdominal pain, blood in stool, constipation, diarrhea and vomiting.  Genitourinary:  Negative for dysuria, enuresis, flank pain, frequency, hematuria, menstrual problem, urgency, vaginal bleeding, vaginal discharge and vaginal pain.  Musculoskeletal:  Positive for gait problem. Negative for back pain, myalgias, neck pain and neck stiffness.  Skin:  Negative for color change, rash and wound.  Allergic/Immunologic: Negative for immunocompromised state.  Neurological:  Positive for dizziness and headaches. Negative for seizures, syncope, weakness, light-headedness and numbness.  Psychiatric/Behavioral:  Negative for confusion.    Physical Exam Updated Vital Signs BP 123/81 (BP Location: Right Arm)   Pulse 75   Temp 98.5 F (36.9 C) (Oral)   Resp 15   Ht '5\' 3"'  (1.6 m)   Wt 56.7 kg   LMP 07/15/2020   SpO2 100%   BMI 22.14 kg/m   Physical Exam Vitals and nursing note reviewed.  Constitutional:      General: She is not in acute distress.    Appearance: She is not ill-appearing, toxic-appearing or diaphoretic.  HENT:     Head: Normocephalic.     Nose: Nose normal. No congestion or rhinorrhea.     Mouth/Throat:     Mouth: Mucous membranes are moist.     Pharynx: No oropharyngeal exudate or posterior oropharyngeal erythema.  Eyes:     General: No scleral icterus.    Conjunctiva/sclera: Conjunctivae normal.  Cardiovascular:     Rate and Rhythm: Normal rate and regular rhythm.     Pulses: Normal pulses.     Heart sounds: Normal heart sounds. No  murmur heard.   No friction rub. No gallop.  Pulmonary:     Effort: Pulmonary effort is normal. No respiratory distress.     Breath sounds: No stridor. No wheezing, rhonchi or rales.  Chest:     Chest wall: No tenderness.  Abdominal:     General: There is no distension.     Palpations: Abdomen is soft. There is no mass.     Tenderness: There is no abdominal tenderness. There is no right CVA tenderness, left CVA tenderness, guarding or rebound.     Hernia: No hernia is present.  Musculoskeletal:     Cervical back: Neck supple.     Right lower leg: No edema.     Left lower leg: No edema.  Skin:    General: Skin is warm.  Capillary Refill: Capillary refill takes less than 2 seconds.     Coloration: Skin is not pale.     Findings: No lesion or rash.  Neurological:     Mental Status: She is alert.     Comments: Mental Status: Patient is awake, alert, oriented to person, place, month, year, and situation. Patient is able to give a clear and coherent history. Cranial Nerves: II: Visual Fields are full. Pupils are equal, round, and reactive to light. III,IV, VI: EOMI without ptosis or diploplia.  V: Facial sensation is symmetric to temperature VII: Facial movement is symmetric.  VIII: hearing is intact to voice X: Uvula elevates symmetrically XI: Shoulder shrug is symmetric. XII: tongue is midline without atrophy or fasciculations.  Motor: Tone is normal. Bulk is normal. 5/5 strength was present in all four extremities.  Sensory: Sensation is symmetric to light touch and temperature in the arms.  Minimally decreased sensation to sharp and soft touch in the right medial lower leg when compared to the left.  No sensation changes in the lateral aspects of the bilateral lower legs. Cerebellar: FNF intact bilaterally    Psychiatric:        Behavior: Behavior normal.    ED Results / Procedures / Treatments   Labs (all labs ordered are listed, but only abnormal results are  displayed) Labs Reviewed  CBC WITH DIFFERENTIAL/PLATELET - Abnormal; Notable for the following components:      Result Value   Hemoglobin 11.3 (*)    HCT 35.4 (*)    RDW 17.5 (*)    All other components within normal limits  COMPREHENSIVE METABOLIC PANEL - Abnormal; Notable for the following components:   Albumin 3.3 (*)    AST 248 (*)    ALT 142 (*)    All other components within normal limits  URINALYSIS, ROUTINE W REFLEX MICROSCOPIC - Abnormal; Notable for the following components:   Color, Urine YELLOW (*)    APPearance CLEAR (*)    Specific Gravity, Urine <1.005 (*)    Hgb urine dipstick LARGE (*)    Leukocytes,Ua MODERATE (*)    Bacteria, UA RARE (*)    All other components within normal limits  RESP PANEL BY RT-PCR (FLU A&B, COVID) [id number]    EKG None  Radiology CT Head Wo Contrast  Result Date: 04/22/2021 CLINICAL DATA:  Headache high blood pressure EXAM: CT HEAD WITHOUT CONTRAST TECHNIQUE: Contiguous axial images were obtained from the base of the skull through the vertex without intravenous contrast. COMPARISON:  None. FINDINGS: Brain: No acute territorial infarction, hemorrhage or intracranial mass. The ventricles are nonenlarged. Vascular: No hyperdense vessel or unexpected calcification. Skull: Normal. Negative for fracture or focal lesion. Sinuses/Orbits: No acute finding. Other: None IMPRESSION: Negative non contrasted CT appearance of the brain Electronically Signed   By: Donavan Foil M.D.   On: 04/22/2021 23:32    Procedures .Critical Care Performed by: Joanne Gavel, PA-C Authorized by: Joanne Gavel, PA-C   Critical care provider statement:    Critical care time (minutes):  45   Critical care time was exclusive of:  Separately billable procedures and treating other patients and teaching time   Critical care was necessary to treat or prevent imminent or life-threatening deterioration of the following conditions: Preeclampsia.   Critical care was time  spent personally by me on the following activities:  Ordering and performing treatments and interventions, ordering and review of laboratory studies, ordering and review of radiographic studies, pulse oximetry, re-evaluation of  patient's condition, obtaining history from patient or surrogate, examination of patient, evaluation of patient's response to treatment, discussions with consultants, development of treatment plan with patient or surrogate and review of old charts   I assumed direction of critical care for this patient from another provider in my specialty: no     Care discussed with: admitting provider     Medications Ordered in ED Medications  metoCLOPramide (REGLAN) injection 10 mg (has no administration in time range)    ED Course  I have reviewed the triage vital signs and the nursing notes.  Pertinent labs & imaging results that were available during my care of the patient were reviewed by me and considered in my medical decision making (see chart for details).    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  She had a brief episode of nausea, dizziness, and gait instability just prior to arrival.  Patient had a vaginal delivery on 9/22 was diagnosed with gestational hypertension during her pregnancy.  Her blood pressure prior to arrival was 144/103.   Differential diagnosis includes postpartum preeclampsia, CVA, venous sinus thrombosis, and migraine.  Initial blood pressure in the emergency department is 149/99, but down trended to 123/81.  Given concern for abnormal gait and dizziness, both of which have resolved, comprehensive neuro exam was completed.  She did have subjective decrease sensation to sharp and light touch to touch over the saphenous nerve distribution on the left  lower leg as compared to the right, but sensation was otherwise symmetric and intact.  On exam, patient had no labs and imaging been reviewed and independently interpreted by me.  Reglan was ordered for headache since patient had Tylenol prior to arrival.  CT head is unremarkable for any acute findings.  She has elevated transaminases on labs.  Urinalysis does not contain protein.  Given concern for preeclampsia, consulted OB/GYN and Dr. Nelda Marseille who is excepted the patient for transfer to [facility] for admission to the OB/GYN service.  The patient appears reasonably stabilized for admission considering the current resources, flow, and capabilities available in the ED at this time, and I doubt any other [facility] requiring further screening and/or treatment in the ED prior to admission.  Final Clinical Impression(s) / ED Diagnoses Final diagnoses:  None    Rx / DC Orders ED Discharge Orders     None        Joanne Gavel, PA-C 04/23/21 0020    Mesner, Corene Cornea, MD 04/23/21 [id number]    Merrily Pew, MD 04/23/21 5027

## 2021-04-22 NOTE — ED Triage Notes (Signed)
Pt reports constant headache since last night that will not go away. Pt also reports high BP of 143/102. Pt reports having a baby a few days ago.

## 2021-04-22 NOTE — ED Notes (Signed)
Patient transported to CT 

## 2021-04-23 DIAGNOSIS — Z20822 Contact with and (suspected) exposure to covid-19: Secondary | ICD-10-CM | POA: Diagnosis not present

## 2021-04-23 DIAGNOSIS — Z3A39 39 weeks gestation of pregnancy: Secondary | ICD-10-CM | POA: Diagnosis not present

## 2021-04-23 DIAGNOSIS — O1495 Unspecified pre-eclampsia, complicating the puerperium: Secondary | ICD-10-CM

## 2021-04-23 HISTORY — DX: Unspecified pre-eclampsia, complicating the puerperium: O14.95

## 2021-04-23 LAB — CBC
HCT: 37.3 % (ref 36.0–46.0)
Hemoglobin: 12.4 g/dL (ref 12.0–15.0)
MCH: 28.4 pg (ref 26.0–34.0)
MCHC: 33.2 g/dL (ref 30.0–36.0)
MCV: 85.6 fL (ref 80.0–100.0)
Platelets: 255 10*3/uL (ref 150–400)
RBC: 4.36 MIL/uL (ref 3.87–5.11)
RDW: 17.6 % — ABNORMAL HIGH (ref 11.5–15.5)
WBC: 7.9 10*3/uL (ref 4.0–10.5)
nRBC: 0 % (ref 0.0–0.2)

## 2021-04-23 LAB — COMPREHENSIVE METABOLIC PANEL
ALT: 161 U/L — ABNORMAL HIGH (ref 0–44)
AST: 279 U/L — ABNORMAL HIGH (ref 15–41)
Albumin: 3.2 g/dL — ABNORMAL LOW (ref 3.5–5.0)
Alkaline Phosphatase: 104 U/L (ref 38–126)
Anion gap: 11 (ref 5–15)
BUN: 10 mg/dL (ref 6–20)
CO2: 25 mmol/L (ref 22–32)
Calcium: 7.2 mg/dL — ABNORMAL LOW (ref 8.9–10.3)
Chloride: 98 mmol/L (ref 98–111)
Creatinine, Ser: 0.71 mg/dL (ref 0.44–1.00)
GFR, Estimated: >60 mL/min (ref >60)
Glucose, Bld: 103 mg/dL — ABNORMAL HIGH (ref 70–99)
Potassium: 4 mmol/L (ref 3.5–5.1)
Sodium: 134 mmol/L — ABNORMAL LOW (ref 135–145)
Total Bilirubin: 0.6 mg/dL (ref 0.3–1.2)
Total Protein: 7 g/dL (ref 6.5–8.1)

## 2021-04-23 LAB — RESP PANEL BY RT-PCR (FLU A&B, COVID) ARPGX2
Influenza A by PCR: NEGATIVE
Influenza B by PCR: NEGATIVE
SARS Coronavirus 2 by RT PCR: NEGATIVE

## 2021-04-23 LAB — MAGNESIUM: Magnesium: 7.4 mg/dL (ref 1.7–2.4)

## 2021-04-23 MED ORDER — NIFEDIPINE 10 MG PO CAPS
10.0000 mg | ORAL_CAPSULE | ORAL | Status: DC | PRN
  Filled 2021-04-23 (×2): qty 1

## 2021-04-23 MED ORDER — LACTATED RINGERS IV SOLN: INTRAVENOUS | Status: DC

## 2021-04-23 MED ORDER — IBUPROFEN 600 MG PO TABS
600.0000 mg | ORAL_TABLET | Freq: Four times a day (QID) | ORAL | Status: DC | PRN
  Administered 2021-04-23: 600 mg via ORAL
  Filled 2021-04-23: qty 1

## 2021-04-23 MED ORDER — NIFEDIPINE ER OSMOTIC RELEASE 30 MG PO TB24
30.0000 mg | ORAL_TABLET | Freq: Every day | ORAL | Status: DC
  Administered 2021-04-23 – 2021-04-24 (×2): 30 mg via ORAL
  Filled 2021-04-23 (×2): qty 1

## 2021-04-23 MED ORDER — ONDANSETRON HCL 4 MG/2ML IJ SOLN: 4.0000 mg | Freq: Four times a day (QID) | INTRAMUSCULAR | Status: DC | PRN

## 2021-04-23 MED ORDER — MAGNESIUM SULFATE BOLUS VIA INFUSION
4.0000 g | Freq: Once | INTRAVENOUS | Status: AC
  Administered 2021-04-23: 4 g via INTRAVENOUS
  Filled 2021-04-23: qty 1000

## 2021-04-23 MED ORDER — LABETALOL HCL 5 MG/ML IV SOLN: 40.0000 mg | INTRAVENOUS | Status: DC | PRN

## 2021-04-23 MED ORDER — ACETAMINOPHEN 325 MG PO TABS
650.0000 mg | ORAL_TABLET | Freq: Once | ORAL | Status: AC
  Administered 2021-04-23: 650 mg via ORAL
  Filled 2021-04-23: qty 2

## 2021-04-23 MED ORDER — METOCLOPRAMIDE HCL 5 MG/ML IJ SOLN
10.0000 mg | Freq: Once | INTRAMUSCULAR | Status: AC
  Administered 2021-04-23: 10 mg via INTRAVENOUS
  Filled 2021-04-23: qty 2

## 2021-04-23 MED ORDER — ACETAMINOPHEN 325 MG PO TABS
650.0000 mg | ORAL_TABLET | ORAL | Status: DC | PRN
  Administered 2021-04-24: 650 mg via ORAL
  Filled 2021-04-23: qty 2

## 2021-04-23 MED ORDER — ONDANSETRON HCL 4 MG PO TABS: 4.0000 mg | ORAL_TABLET | Freq: Four times a day (QID) | ORAL | Status: DC | PRN

## 2021-04-23 MED ORDER — NIFEDIPINE 10 MG PO CAPS
20.0000 mg | ORAL_CAPSULE | ORAL | Status: DC | PRN
  Filled 2021-04-23: qty 2

## 2021-04-23 MED ORDER — MAGNESIUM SULFATE 40 GM/1000ML IV SOLN
1.0000 g/h | INTRAVENOUS | Status: AC
  Administered 2021-04-23: 2 g/h via INTRAVENOUS
  Filled 2021-04-23 (×2): qty 1000

## 2021-04-23 NOTE — ED Notes (Signed)
[facility] called at this time for transport  ?

## 2021-04-23 NOTE — ED Notes (Signed)
[facility] at bedside to transport pt 

## 2021-04-23 NOTE — Plan of Care (Signed)
  Problem: Education: Goal: Knowledge of disease or condition will improve Outcome: Progressing Goal: Knowledge of the prescribed therapeutic regimen will improve Outcome: Progressing   Problem: Fluid Volume: Goal: Peripheral tissue perfusion will improve Outcome: Progressing   Problem: Clinical Measurements: Goal: Complications related to disease process, condition or treatment will be avoided or minimized Outcome: Progressing   

## 2021-04-23 NOTE — H&P (Signed)
[facility] Attending History and Physical  Brooke Patel is a 25 y.o. [id number] s/p NSVD on 04/16/21- admitted due to postpartum preeclampsia.  Pt presented to [facility] long with worsening HA, elevated BPs and noted to have elevated LFTs. She still reports headache currently, but feels like it's a bit better than before.  Currently a 2/10.  Notes intermittent blurry vision, denies this problem currently.  Denies CP, SOB or RUQ pain.  Lochia minimal.  Ambulating and voiding without difficulty.  No nausea/vomiting.  No other acute complaints    Past Medical History:  Diagnosis Date   Anemia    Past Surgical History:  Procedure Laterality Date   NO PAST SURGERIES     OB History  Gravida Para Term Preterm AB Living  '4 4 4 ' 0   4  SAB IAB Ectopic Multiple Live Births  0 0 0 0 4    # Outcome Date GA Lbr Len/2nd Weight Sex Delivery Anes PTL Lv  4 Term 04/16/21 41w2d01:26 / 00:05 3436 g F Vag-Spont None  LIV  3 Term 10/24/19 471w6d9:02 / 00:06 2716 g F Vag-Spont None  LIV  2 Term 2014 [id number] g F Vag-Spont Local  LIV     Birth Comments: no complications  1 Term 09/62/83/15w86w1d13 / 00:36 2995 g F Vag-Spont Local  LIV     Birth Comments: no anomalies or problems  Patient denies any other pertinent gynecologic issues.  No current facility-administered medications on file prior to encounter.   Current Outpatient Medications on File Prior to Encounter  Medication Sig Dispense Refill   acetaminophen (TYLENOL) 500 MG tablet Take 2 tablets (1,000 mg total) by mouth every 8 (eight) hours as needed (pain). (Patient taking differently: Take 500 mg by mouth every 8 (eight) hours as needed (for pain).) 60 tablet 0   ibuprofen (ADVIL) 600 MG tablet Take 1 tablet (600 mg total) by mouth every 6 (six) hours as needed (pain). 40 tablet 0   Blood Pressure Monitoring (BLOOD PRESSURE KIT) DEVI 1 Device by Does not apply route as needed. ICD 10 Z34.90 (Patient not taking: No  sig reported) 1 Device 0   Prenatal Vit-Fe Fumarate-FA (PRENATAL VITAMIN) 27-0.8 MG TABS Take 1 tablet by mouth daily. (Patient not taking: Reported on 04/22/2021) 30 tablet 11   No Known Allergies  Social History:   reports that she has never smoked. She has never used smokeless tobacco. She reports that she does not drink alcohol and does not use drugs. Family History  Problem Relation Age of Onset   Cancer Sister    Diabetes Paternal Grandmother    Cancer Paternal Grandmother     Review of Systems: Pertinent items noted in HPI and remainder of comprehensive ROS otherwise negative.  PHYSICAL EXAM: Blood pressure 140/88, pulse 90, temperature 98 F (36.7 C), temperature source Oral, resp. rate 16, height '5\' 3"'  (1.6 m), weight 56.7 kg, last menstrual period 07/15/2020, SpO2 98 %, unknown if currently breastfeeding. CONSTITUTIONAL: Well-developed, well-nourished female in no acute distress.  EYES: Conjunctivae and EOM are normal. Pupils are equal, round, and reactive to light. No scleral icterus.  NECK: Normal range of motion, supple, no masses SKIN: Skin is warm and dry. No rash noted. Not diaphoretic. No erythema. No pallor. PSYCHIATRIC: Normal mood and affect. Normal behavior. Normal judgment and thought content. CARDIOVASCULAR: Normal heart rate noted, regular rhythm RESPIRATORY: Effort and breath sounds normal, no problems with respiration noted ABDOMEN: Soft, nontender,  nondistended. EXT: no edema, no calf tenderness Neuro: DTRs 2+, no clonus  Labs: Results for orders placed or performed during the hospital encounter of 04/22/21 (from the past 336 hour(s))  CBC with Differential   Collection Time: 04/22/21 11:21 PM  Result Value Ref Range   WBC 8.6 4.0 - 10.5 K/uL   RBC 4.05 3.87 - 5.11 MIL/uL   Hemoglobin 11.3 (L) 12.0 - 15.0 g/dL   HCT 35.4 (L) 36.0 - 46.0 %   MCV 87.4 80.0 - 100.0 fL   MCH 27.9 26.0 - 34.0 pg   MCHC 31.9 30.0 - 36.0 g/dL   RDW 17.5 (H) 11.5 - 15.5 %    Platelets 211 150 - 400 K/uL   nRBC 0.0 0.0 - 0.2 %   Neutrophils Relative % 65 %   Neutro Abs 5.8 1.7 - 7.7 K/uL   Lymphocytes Relative 25 %   Lymphs Abs 2.1 0.7 - 4.0 K/uL   Monocytes Relative 6 %   Monocytes Absolute 0.5 0.1 - 1.0 K/uL   Eosinophils Relative 2 %   Eosinophils Absolute 0.2 0.0 - 0.5 K/uL   Basophils Relative 1 %   Basophils Absolute 0.0 0.0 - 0.1 K/uL   Immature Granulocytes 1 %   Abs Immature Granulocytes 0.04 0.00 - 0.07 K/uL  Comprehensive metabolic panel   Collection Time: 04/22/21 11:21 PM  Result Value Ref Range   Sodium 138 135 - 145 mmol/L   Potassium 3.8 3.5 - 5.1 mmol/L   Chloride 103 98 - 111 mmol/L   CO2 23 22 - 32 mmol/L   Glucose, Bld 92 70 - 99 mg/dL   BUN 18 6 - 20 mg/dL   Creatinine, Ser 0.74 0.44 - 1.00 mg/dL   Calcium 9.1 8.9 - 10.3 mg/dL   Total Protein 6.9 6.5 - 8.1 g/dL   Albumin 3.3 (L) 3.5 - 5.0 g/dL   AST 248 (H) 15 - 41 U/L   ALT 142 (H) 0 - 44 U/L   Alkaline Phosphatase 99 38 - 126 U/L   Total Bilirubin 0.5 0.3 - 1.2 mg/dL   GFR, Estimated >60 >60 mL/min   Anion gap 12 5 - 15  Urinalysis, Routine w reflex microscopic Urine, Unspecified Source   Collection Time: 04/22/21 11:21 PM  Result Value Ref Range   Color, Urine YELLOW (A) YELLOW   APPearance CLEAR (A) CLEAR   Specific Gravity, Urine <1.005 (L) 1.005 - 1.030   pH 6.0 5.0 - 8.0   Glucose, UA NEGATIVE NEGATIVE mg/dL   Hgb urine dipstick LARGE (A) NEGATIVE   Bilirubin Urine NEGATIVE NEGATIVE   Ketones, ur NEGATIVE NEGATIVE mg/dL   Protein, ur NEGATIVE NEGATIVE mg/dL   Nitrite NEGATIVE NEGATIVE   Leukocytes,Ua MODERATE (A) NEGATIVE   RBC / HPF 6-10 0 - 5 RBC/hpf   WBC, UA 6-10 0 - 5 WBC/hpf   Bacteria, UA RARE (A) NONE SEEN   Squamous Epithelial / LPF 0-5 0 - 5   Mucus PRESENT   Resp Panel by RT-PCR (Flu A&B, Covid) Nasopharyngeal Swab   Collection Time: 04/23/21 12:50 AM   Specimen: Nasopharyngeal Swab; Nasopharyngeal(NP) swabs in vial transport medium  Result  Value Ref Range   SARS Coronavirus 2 by RT PCR NEGATIVE NEGATIVE   Influenza A by PCR NEGATIVE NEGATIVE   Influenza B by PCR NEGATIVE NEGATIVE  Results for orders placed or performed during the hospital encounter of 04/15/21 (from the past 336 hour(s))  CBC   Collection Time: 04/15/21  2:39  PM  Result Value Ref Range   WBC 8.4 4.0 - 10.5 K/uL   RBC 3.79 (L) 3.87 - 5.11 MIL/uL   Hemoglobin 10.5 (L) 12.0 - 15.0 g/dL   HCT 32.1 (L) 36.0 - 46.0 %   MCV 84.7 80.0 - 100.0 fL   MCH 27.7 26.0 - 34.0 pg   MCHC 32.7 30.0 - 36.0 g/dL   RDW 16.4 (H) 11.5 - 15.5 %   Platelets 157 150 - 400 K/uL   nRBC 0.2 0.0 - 0.2 %  Comprehensive metabolic panel   Collection Time: 04/15/21  2:39 PM  Result Value Ref Range   Sodium 134 (L) 135 - 145 mmol/L   Potassium 3.9 3.5 - 5.1 mmol/L   Chloride 101 98 - 111 mmol/L   CO2 22 22 - 32 mmol/L   Glucose, Bld 75 70 - 99 mg/dL   BUN 9 6 - 20 mg/dL   Creatinine, Ser 0.85 0.44 - 1.00 mg/dL   Calcium 8.7 (L) 8.9 - 10.3 mg/dL   Total Protein 6.2 (L) 6.5 - 8.1 g/dL   Albumin 2.8 (L) 3.5 - 5.0 g/dL   AST 25 15 - 41 U/L   ALT 13 0 - 44 U/L   Alkaline Phosphatase 159 (H) 38 - 126 U/L   Total Bilirubin 0.7 0.3 - 1.2 mg/dL   GFR, Estimated >60 >60 mL/min   Anion gap 11 5 - 15  Type and screen Pace   Collection Time: 04/15/21  2:40 PM  Result Value Ref Range   ABO/RH(D) A POS    Antibody Screen NEG    Sample Expiration      04/18/2021,2359 Performed at [facility], [address], [place], [place] [address]   Protein / creatinine ratio, urine   Collection Time: 04/15/21  3:18 PM  Result Value Ref Range   Creatinine, Urine 84.63 mg/dL   Total Protein, Urine 8 mg/dL   Protein Creatinine Ratio 0.09 0.00 - 0.15 mg/mg[Cre]  Resp Panel by RT-PCR (Flu A&B, Covid) Nasopharyngeal Swab   Collection Time: 04/15/21  3:41 PM   Specimen: Nasopharyngeal Swab; Nasopharyngeal(NP) swabs in vial transport medium  Result Value Ref Range    SARS Coronavirus 2 by RT PCR NEGATIVE NEGATIVE   Influenza A by PCR NEGATIVE NEGATIVE   Influenza B by PCR NEGATIVE NEGATIVE  RPR   Collection Time: 04/15/21  3:51 PM  Result Value Ref Range   RPR Ser Ql NON REACTIVE NON REACTIVE  CBC   Collection Time: 04/16/21  5:32 AM  Result Value Ref Range   WBC 13.6 (H) 4.0 - 10.5 K/uL   RBC 3.61 (L) 3.87 - 5.11 MIL/uL   Hemoglobin 10.2 (L) 12.0 - 15.0 g/dL   HCT 30.5 (L) 36.0 - 46.0 %   MCV 84.5 80.0 - 100.0 fL   MCH 28.3 26.0 - 34.0 pg   MCHC 33.4 30.0 - 36.0 g/dL   RDW 16.6 (H) 11.5 - 15.5 %   Platelets 138 (L) 150 - 400 K/uL   nRBC 0.0 0.0 - 0.2 %    Imaging Studies: CT Head Wo Contrast  Result Date: 04/22/2021 CLINICAL DATA:  Headache high blood pressure EXAM: CT HEAD WITHOUT CONTRAST TECHNIQUE: Contiguous axial images were obtained from the base of the skull through the vertex without intravenous contrast. COMPARISON:  None. FINDINGS: Brain: No acute territorial infarction, hemorrhage or intracranial mass. The ventricles are nonenlarged. Vascular: No hyperdense vessel or unexpected calcification. Skull: Normal. Negative for  fracture or focal lesion. Sinuses/Orbits: No acute finding. Other: None IMPRESSION: Negative non contrasted CT appearance of the brain Electronically Signed   By: Donavan Foil M.D.   On: 04/22/2021 23:32    Assessment: Active Problems:   Preeclampsia in postpartum period   Plan: -IV Mag x 24hr -start Procardia XL 26m daily, close monitoring of BP -labs as above, repeat lab work later today -routine postpartum care   JJanyth Pupa DO Attending [place] FProduct/process development scientistfor [employer] [place]

## 2021-04-24 ENCOUNTER — Other Ambulatory Visit (HOSPITAL_COMMUNITY): Payer: Self-pay

## 2021-04-24 DIAGNOSIS — O1495 Unspecified pre-eclampsia, complicating the puerperium: Secondary | ICD-10-CM | POA: Diagnosis not present

## 2021-04-24 MED ORDER — NIFEDIPINE ER 30 MG PO TB24
30.0000 mg | ORAL_TABLET | Freq: Every day | ORAL | 0 refills | Status: DC
  Filled 2021-04-24: qty 30, 30d supply, fill #0

## 2021-04-24 NOTE — Progress Notes (Signed)
OB Discharge Summary     Patient Name: Brooke Patel DOB: [birth year] MRN: [id number]  Date of admission: 04/22/2021 Delivering MD: Gifford Shave   Date of discharge: 04/24/2021  Admitting diagnosis: Preeclampsia in postpartum period [O14.95] Intrauterine pregnancy: [redacted]w[redacted]d    Secondary diagnosis:  Active Problems:   Preeclampsia in postpartum period  Additional problems: none     Discharge diagnosis:  postpartum preeclampsia                                                                                                 Post partum procedures: received magnesium sulfate  Hospital course:  Brooke Reamesis a 25y.o. [id number] NSVD on 04/16/21- admitted due to postpartum preeclampsia.  [facility], elevated BPs and noted to have elevated LFTs. She still reports headache currently, but feels like it's a bit better than before.  Currently a 2/10.  Notes intermittent blurry vision, denies this problem currently.  Denies CP, SOB or RUQ pain.  Lochia minimal.  Ambulating and voiding without difficulty.  No nausea/vomiting.  No other acute complaints         Past Medical History:  Diagnosis Date   Anemia           Past Surgical History:  Procedure Laterality Date   NO PAST SURGERIES                        OB History  Gravida Para Term Preterm AB Living  '4 4 4 ' 0   4  SAB IAB Ectopic Multiple Live Births     0 0 0 0 4        # Outcome Date GA Lbr Len/2nd Weight Sex Delivery Anes PTL Lv  4 Term 04/16/21 359w2d1:26 / 00:05 3436 g F Vag-Spont None   LIV  3 Term 10/24/19 4054w6d:02 / 00:06 2716 g F Vag-Spont None   LIV  2 Term 2014 [id number] g F Vag-Spont Local   LIV     Birth Comments: no complications  1 Term 09/040/34/74151w1d3 / 00:36 2995 g F Vag-Spont Local   LIV     Birth Comments: no anomalies or problems  Patient denies any other pertinent gynecologic issues.  No current facility-administered medications on file prior to  encounter.          Current Outpatient Medications on File Prior to Encounter  Medication Sig Dispense Refill   acetaminophen (TYLENOL) 500 MG tablet Take 2 tablets (1,000 mg total) by mouth every 8 (eight) hours as needed (pain). (Patient taking differently: Take 500 mg by mouth every 8 (eight) hours as needed (for pain).) 60 tablet 0   ibuprofen (ADVIL) 600 MG tablet Take 1 tablet (600 mg total) by mouth every 6 (six) hours as needed (pain). 40 tablet 0   Blood Pressure Monitoring (BLOOD PRESSURE KIT) DEVI 1 Device by Does not apply route as needed. ICD 10 Z34.90 (Patient not taking: No sig reported) 1 Device 0   Prenatal Vit-Fe Fumarate-FA (PRENATAL VITAMIN)  27-0.8 MG TABS Take 1 tablet by mouth daily. (Patient not taking: Reported on 04/22/2021) 30 tablet 11    No Known Allergies   Social History:   reports that she has never smoked. She has never used smokeless tobacco. She reports that she does not drink alcohol and does not use drugs.      Family History  Problem Relation Age of Onset   Cancer Sister     Diabetes Paternal Grandmother     Cancer Paternal Grandmother        Review of Systems: Pertinent items noted in HPI and remainder of comprehensive ROS otherwise negative.   PHYSICAL EXAM: Blood pressure 140/88, pulse 90, temperature 98 F (36.7 C), temperature source Oral, resp. rate 16, height '5\' 3"'  (1.6 m), weight 56.7 kg, last menstrual period 07/15/2020, SpO2 98 %, unknown if currently breastfeeding. CONSTITUTIONAL: Well-developed, well-nourished female in no acute distress.  EYES: Conjunctivae and EOM are normal. Pupils are equal, round, and reactive to light. No scleral icterus.  NECK: Normal range of motion, supple, no masses SKIN: Skin is warm and dry. No rash noted. Not diaphoretic. No erythema. No pallor. PSYCHIATRIC: Normal mood and affect. Normal behavior. Normal judgment and thought content. CARDIOVASCULAR: Normal heart rate noted, regular rhythm RESPIRATORY:  Effort and breath sounds normal, no problems with respiration noted ABDOMEN: Soft, nontender, nondistended. EXT: no edema, no calf tenderness Neuro: DTRs 2+, no clonus   Labs:       Results for orders placed or performed during the hospital encounter of 04/22/21 (from the past 336 hour(s))  CBC with Differential    Collection Time: 04/22/21 11:21 PM  Result Value Ref Range    WBC 8.6 4.0 - 10.5 K/uL    RBC 4.05 3.87 - 5.11 MIL/uL    Hemoglobin 11.3 (L) 12.0 - 15.0 g/dL    HCT 35.4 (L) 36.0 - 46.0 %    MCV 87.4 80.0 - 100.0 fL    MCH 27.9 26.0 - 34.0 pg    MCHC 31.9 30.0 - 36.0 g/dL    RDW 17.5 (H) 11.5 - 15.5 %    Platelets 211 150 - 400 K/uL    nRBC 0.0 0.0 - 0.2 %    Neutrophils Relative % 65 %    Neutro Abs 5.8 1.7 - 7.7 K/uL    Lymphocytes Relative 25 %    Lymphs Abs 2.1 0.7 - 4.0 K/uL    Monocytes Relative 6 %    Monocytes Absolute 0.5 0.1 - 1.0 K/uL    Eosinophils Relative 2 %    Eosinophils Absolute 0.2 0.0 - 0.5 K/uL    Basophils Relative 1 %    Basophils Absolute 0.0 0.0 - 0.1 K/uL    Immature Granulocytes 1 %    Abs Immature Granulocytes 0.04 0.00 - 0.07 K/uL  Comprehensive metabolic panel    Collection Time: 04/22/21 11:21 PM  Result Value Ref Range    Sodium 138 135 - 145 mmol/L    Potassium 3.8 3.5 - 5.1 mmol/L    Chloride 103 98 - 111 mmol/L    CO2 23 22 - 32 mmol/L    Glucose, Bld 92 70 - 99 mg/dL    BUN 18 6 - 20 mg/dL    Creatinine, Ser 0.74 0.44 - 1.00 mg/dL    Calcium 9.1 8.9 - 10.3 mg/dL    Total Protein 6.9 6.5 - 8.1 g/dL    Albumin 3.3 (L) 3.5 - 5.0 g/dL    AST 248 (H)  15 - 41 U/L    ALT 142 (H) 0 - 44 U/L    Alkaline Phosphatase 99 38 - 126 U/L    Total Bilirubin 0.5 0.3 - 1.2 mg/dL    GFR, Estimated >60 >60 mL/min    Anion gap 12 5 - 15  Urinalysis, Routine w reflex microscopic Urine, Unspecified Source    Collection Time: 04/22/21 11:21 PM  Result Value Ref Range    Color, Urine YELLOW (A) YELLOW    APPearance CLEAR (A) CLEAR     Specific Gravity, Urine <1.005 (L) 1.005 - 1.030    pH 6.0 5.0 - 8.0    Glucose, UA NEGATIVE NEGATIVE mg/dL    Hgb urine dipstick LARGE (A) NEGATIVE    Bilirubin Urine NEGATIVE NEGATIVE    Ketones, ur NEGATIVE NEGATIVE mg/dL    Protein, ur NEGATIVE NEGATIVE mg/dL    Nitrite NEGATIVE NEGATIVE    Leukocytes,Ua MODERATE (A) NEGATIVE    RBC / HPF 6-10 0 - 5 RBC/hpf    WBC, UA 6-10 0 - 5 WBC/hpf    Bacteria, UA RARE (A) NONE SEEN    Squamous Epithelial / LPF 0-5 0 - 5    Mucus PRESENT    Resp Panel by RT-PCR (Flu A&B, Covid) Nasopharyngeal Swab    Collection Time: 04/23/21 12:50 AM    Specimen: Nasopharyngeal Swab; Nasopharyngeal(NP) swabs in vial transport medium  Result Value Ref Range    SARS Coronavirus 2 by RT PCR NEGATIVE NEGATIVE    Influenza A by PCR NEGATIVE NEGATIVE    Influenza B by PCR NEGATIVE NEGATIVE  Results for orders placed or performed during the hospital encounter of 04/15/21 (from the past 336 hour(s))  CBC    Collection Time: 04/15/21  2:39 PM  Result Value Ref Range    WBC 8.4 4.0 - 10.5 K/uL    RBC 3.79 (L) 3.87 - 5.11 MIL/uL    Hemoglobin 10.5 (L) 12.0 - 15.0 g/dL    HCT 32.1 (L) 36.0 - 46.0 %    MCV 84.7 80.0 - 100.0 fL    MCH 27.7 26.0 - 34.0 pg    MCHC 32.7 30.0 - 36.0 g/dL    RDW 16.4 (H) 11.5 - 15.5 %    Platelets 157 150 - 400 K/uL    nRBC 0.2 0.0 - 0.2 %  Comprehensive metabolic panel    Collection Time: 04/15/21  2:39 PM  Result Value Ref Range    Sodium 134 (L) 135 - 145 mmol/L    Potassium 3.9 3.5 - 5.1 mmol/L    Chloride 101 98 - 111 mmol/L    CO2 22 22 - 32 mmol/L    Glucose, Bld 75 70 - 99 mg/dL    BUN 9 6 - 20 mg/dL    Creatinine, Ser 0.85 0.44 - 1.00 mg/dL    Calcium 8.7 (L) 8.9 - 10.3 mg/dL    Total Protein 6.2 (L) 6.5 - 8.1 g/dL    Albumin 2.8 (L) 3.5 - 5.0 g/dL    AST 25 15 - 41 U/L    ALT 13 0 - 44 U/L    Alkaline Phosphatase 159 (H) 38 - 126 U/L    Total Bilirubin 0.7 0.3 - 1.2 mg/dL    GFR, Estimated >60 >60 mL/min    Anion  gap 11 5 - 15  Type and screen [place]    Collection Time: 04/15/21  2:40 PM  Result Value Ref Range    ABO/RH(D) A  POS      Antibody Screen NEG      Sample Expiration          04/18/2021,2359 Performed at [facility], [place] [address] [place], [place] [address]    Protein / creatinine ratio, urine    Collection Time: 04/15/21  3:18 PM  Result Value Ref Range    Creatinine, Urine 84.63 mg/dL    Total Protein, Urine 8 mg/dL    Protein Creatinine Ratio 0.09 0.00 - 0.15 mg/mg[Cre]  Resp Panel by RT-PCR (Flu A&B, Covid) Nasopharyngeal Swab    Collection Time: 04/15/21  3:41 PM    Specimen: Nasopharyngeal Swab; Nasopharyngeal(NP) swabs in vial transport medium  Result Value Ref Range    SARS Coronavirus 2 by RT PCR NEGATIVE NEGATIVE    Influenza A by PCR NEGATIVE NEGATIVE    Influenza B by PCR NEGATIVE NEGATIVE  RPR    Collection Time: 04/15/21  3:51 PM  Result Value Ref Range    RPR Ser Ql NON REACTIVE NON REACTIVE  CBC    Collection Time: 04/16/21  5:32 AM  Result Value Ref Range    WBC 13.6 (H) 4.0 - 10.5 K/uL    RBC 3.61 (L) 3.87 - 5.11 MIL/uL    Hemoglobin 10.2 (L) 12.0 - 15.0 g/dL    HCT 30.5 (L) 36.0 - 46.0 %    MCV 84.5 80.0 - 100.0 fL    MCH 28.3 26.0 - 34.0 pg    MCHC 33.4 30.0 - 36.0 g/dL    RDW 16.6 (H) 11.5 - 15.5 %    Platelets 138 (L) 150 - 400 K/uL    nRBC 0.0 0.0 - 0.2 %      Imaging Studies:  Imaging Results  CT Head Wo Contrast   Result Date: 04/22/2021 CLINICAL DATA:  Headache high blood pressure EXAM: CT HEAD WITHOUT CONTRAST TECHNIQUE: Contiguous axial images were obtained from the base of the skull through the vertex without intravenous contrast. COMPARISON:  None. FINDINGS: Brain: No acute territorial infarction, hemorrhage or intracranial mass. The ventricles are nonenlarged. Vascular: No hyperdense vessel or unexpected calcification. Skull: Normal. Negative for fracture or focal lesion. Sinuses/Orbits: No acute finding.  Other: None IMPRESSION: Negative non contrasted CT appearance of the brain Electronically Signed   By: Donavan Foil M.D.   On: 04/22/2021 23:32       Assessment: Active Problems:   Preeclampsia in postpartum period     Plan: -IV Mag x 24hr -start Procardia XL 72m daily, close monitoring of BP -labs as above, repeat lab work later today -routine postpartum care  Patient did well after receiving IV magnesium and antihypertensives, discharged home stable  Physical exam  Vitals:   04/24/21 0010 04/24/21 0348 04/24/21 0759 04/24/21 1150  BP: 114/70 110/64 113/71 114/67  Pulse: 79 90 77 71  Resp: '18 18 18 18  ' Temp: 98 F (36.7 C) 98.4 F (36.9 C) 98.3 F (36.8 C) 98.3 F (36.8 C)  TempSrc: Oral Oral Oral Oral  SpO2: 100% 97% 99% 99%  Weight:      Height:       General: alert, cooperative, and no distress Lochia: appropriate Uterine Fundus: firm DVT Evaluation: No evidence of DVT seen on physical exam. Labs: Lab Results  Component Value Date   WBC 7.9 04/23/2021   HGB 12.4 04/23/2021   HCT 37.3 04/23/2021   MCV 85.6 04/23/2021   PLT 255 04/23/2021   CMP Latest Ref Rng & Units 04/23/2021  Glucose  70 - 99 mg/dL 103(H)  BUN 6 - 20 mg/dL 10  Creatinine 0.44 - 1.00 mg/dL 0.71  Sodium 135 - 145 mmol/L 134(L)  Potassium 3.5 - 5.1 mmol/L 4.0  Chloride 98 - 111 mmol/L 98  CO2 22 - 32 mmol/L 25  Calcium 8.9 - 10.3 mg/dL 7.2(L)  Total Protein 6.5 - 8.1 g/dL 7.0  Total Bilirubin 0.3 - 1.2 mg/dL 0.6  Alkaline Phos 38 - 126 U/L 104  AST 15 - 41 U/L 279(H)  ALT 0 - 44 U/L 161(H)    Discharge instruction: per After Visit Summary and "Baby and Me Booklet".  After visit meds:  Allergies as of 04/24/2021   No Known Allergies      Medication List     TAKE these medications    acetaminophen 500 MG tablet Commonly known as: TYLENOL Take 2 tablets (1,000 mg total) by mouth every 8 (eight) hours as needed (pain). What changed:  how much to take reasons to take this    Blood Pressure Kit Devi 1 Device by Does not apply route as needed. ICD 10 Z34.90   ibuprofen 600 MG tablet Commonly known as: ADVIL Take 1 tablet (600 mg total) by mouth every 6 (six) hours as needed (pain).   NIFEdipine 30 MG 24 hr tablet Commonly known as: ADALAT CC Take 1 tablet (30 mg total) by mouth daily. Start taking on: April 25, 2021   Prenatal Vitamin 27-0.8 MG Tabs Take 1 tablet by mouth daily.        Diet: routine diet  Activity: Advance as tolerated. Pelvic rest for 6 weeks.   Outpatient follow up:2 weeks Follow up Appt: Future Appointments  Date Time Provider [place]  05/19/2021  8:15 AM Patriciaann Clan, DO [facility]   Follow up Visit:No follow-ups on file.    Newborn Data: Live born female  Birth Weight: 7 lb 9.2 oz (3436 g) APGAR: 9, 9  Newborn Delivery   Birth date/time: 04/16/2021 00:31:00 Delivery type: Vaginal, Spontaneous        04/24/2021 Emeterio Reeve, MD

## 2021-04-24 NOTE — Progress Notes (Signed)
Obstetrics Progress Note  Admission Date: 04/22/2021 Current Date: 04/24/2021 7:54 AM  Brooke Patel is a 25 y.o. [id number] HD#2 admitted for postpartum preeclampsia.   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  Feels a lot better, especially now that the magnesium sulfate has been discontinued.  She does note a very mild headache on her right side, but has taken no medication for it.  She denies visual changes or RUQ pain.  Objective:   Vitals:   04/23/21 1838 04/23/21 1953 04/24/21 0010 04/24/21 0348  BP: (!) 98/53 112/61 114/70 110/64  Pulse: 96 90 79 90  Resp:  18 18 18   Temp:  98.9 F (37.2 C) 98 F (36.7 C) 98.4 F (36.9 C)  TempSrc:  Oral Oral Oral  SpO2:  100% 100% 97%  Weight:      Height:        Temp:  [97.8 F (36.6 C)-98.9 F (37.2 C)] 98.4 F (36.9 C) (09/30 0348) Pulse Rate:  [72-101] 90 (09/30 0348) Resp:  [16-21] 18 (09/30 0348) BP: (94-138)/(44-84) 110/64 (09/30 0348) SpO2:  [97 %-100 %] 97 % (09/30 0348) I/O last 3 completed shifts: In: 3092.1 [P.O.:1080; I.V.:2012.1] Out: 3900 [Urine:3900] No intake/output data recorded.  Intake/Output Summary (Last 24 hours) at 04/24/2021  0754 Last data filed at 04/24/2021 0500 Gross per 24 hour  Intake 2852.11 ml  Output 3400 ml  Net -547.89 ml     Current Vital Signs 24h Vital Sign Ranges  T 98.4 F (36.9 C) Temp  Avg: 98.2 F (36.8 C)  Min: 97.8 F (36.6 C)  Max: 98.9 F (37.2 C)  BP 110/64 BP  Min: 94/44  Max: 138/77  HR 90 Pulse  Avg: 86.3  Min: 72  Max: 101  RR 18 Resp  Avg: 18.1  Min: 16  Max: 21  SaO2 97 %  (room air) SpO2  Avg: 99.2 %  Min: 97 %  Max: 100 %       24 Hour I/O Current Shift I/O  Time Ins Outs 09/29 0701 - 09/30 0700 In: 2852.1 [P.O.:840; I.V.:2012.1] Out: 3400 [Urine:3400] No intake/output data recorded.   Patient Vitals for the past 12 hrs:  BP Temp Temp src Pulse Resp SpO2  04/24/21 0348 110/64 98.4 F (36.9 C) Oral 90 18 97 %  04/24/21 0010 114/70 98 F (36.7 C) Oral 79 18 100 %     Patient Vitals for the past 24 hrs:  BP Temp Temp src Pulse Resp SpO2  04/24/21 0348 110/64 98.4 F (36.9 C) Oral 90 18 97 %  04/24/21 0010 114/70 98 F (36.7 C) Oral 79 18 100 %  04/23/21 1953 112/61 98.9 F (37.2 C) Oral 90 18 100 %  04/23/21 1838 (!) 98/53 -- -- 96 -- --  04/23/21 1836 04/25/21)  99/55 -- -- 92 -- --  04/23/21 1835 (!) 100/47 -- -- 87 -- --  04/23/21 1834 (!) 100/47 -- -- 86 20 98 %  04/23/21 1715 -- -- -- -- 18 --  04/23/21 1608 -- -- -- -- 18 --  04/23/21 1525 129/77 98.1 F (36.7 C) Oral 88 16 100 %  04/23/21 1328 -- -- -- -- 18 --  04/23/21 1220 -- -- -- -- 17 --  04/23/21 1152 124/84 97.8 F (36.6 C) Oral 85 18 100 %  04/23/21 1124 -- -- -- -- -- 99 %  04/23/21 1123 128/81 98 F (36.7 C) Oral 77 18 99 %  04/23/21 1038 129/76 -- -- (!) 101 -- --  04/23/21 1037 (!) 98/58 -- -- 72 -- --  04/23/21 1036 -- -- -- -- 17 --  04/23/21 1000 109/66 -- -- 80 -- --  04/23/21 0958 109/66 -- -- -- -- --  04/23/21 0956 106/67 -- -- -- 18 --  04/23/21 0908 -- -- -- -- (!) 21 --  04/23/21 0810 138/77 98 F (36.7 C) Oral 98 18 100 %  04/23/21 0800 (!) 94/44 -- -- 74 -- --     Physical exam: General appearance: alert, cooperative, appears stated age, and no distress Abdomen: soft, non-tender; bowel sounds normal; no masses,  no organomegaly GU: No gross VB Lungs: clear to auscultation bilaterally Heart: regular rate and rhythm Extremities: no lower extremity edema Skin: wnl Psych: appropriate Neurologic: Grossly normal  Medications Current Facility-Administered Medications  Medication Dose Route Frequency Provider Last Rate Last Admin   acetaminophen (TYLENOL) tablet 650 mg  650 mg Oral Q4H PRN Myna Hidalgo, DO       ibuprofen (ADVIL) tablet 600 mg  600 mg Oral Q6H PRN Myna Hidalgo, DO   600 mg at 04/23/21 1328   NIFEdipine (PROCARDIA) capsule 10 mg  10 mg Oral PRN Myna Hidalgo, DO       And   NIFEdipine (PROCARDIA) capsule 20 mg  20 mg Oral PRN Myna Hidalgo, DO       And   NIFEdipine (PROCARDIA) capsule 20 mg  20 mg Oral PRN Myna Hidalgo, DO       And   labetalol (NORMODYNE) injection 40 mg  40 mg Intravenous PRN Myna Hidalgo, DO       lactated ringers infusion   Intravenous Continuous Myna Hidalgo, DO 75 mL/hr at 04/23/21 1608 New Bag at 04/23/21 1608   NIFEdipine (PROCARDIA-XL/NIFEDICAL-XL) 24 hr tablet 30 mg  30 mg Oral Daily Ozan, Ivalee, DO   30 mg at 04/23/21 1038   ondansetron (ZOFRAN) tablet 4 mg  4 mg Oral Q6H PRN Myna Hidalgo, DO       Or   ondansetron (ZOFRAN) injection 4 mg  4 mg Intravenous Q6H PRN Vivi, Piccirilli, DO          Labs  Recent Labs  Lab 04/22/21 2321 04/23/21 1210  WBC 8.6 7.9  HGB 11.3* 12.4  HCT 35.4* 37.3  PLT 211 255    Recent Labs  Lab 04/22/21 2321 04/23/21 1210  NA 138 134*  K 3.8 4.0  CL 103 98  CO2 23 25  BUN 18 10  CREATININE 0.74 0.71  CALCIUM 9.1 7.2*  PROT 6.9 7.0  BILITOT 0.5 0.6  ALKPHOS 99 104  ALT 142* 161*  AST 248* 279*  GLUCOSE 92 103*    Radiology N/a  Assessment & Plan:  HD 2 with postpartum preeclampsia. BP improved on procardia Serial  Bps, if still  normal by afternoon anticipate discharge on procardia with BP check in 1 week  Code Status: Full Code  Mariel Aloe, MD Attending Center for [facility])

## 2021-04-27 NOTE — Discharge Summary (Signed)
OB Discharge Summary                            Patient Name: Brooke Patel DOB: 1995/08/25 MRN: [id number]   Date of admission: 04/22/2021 Delivering MD: Gifford Shave    Date of discharge: 04/24/2021   Admitting diagnosis: Preeclampsia in postpartum period [O14.95] Intrauterine pregnancy: [redacted]w[redacted]d    Secondary diagnosis:  Active Problems:   Preeclampsia in postpartum period   Additional problems: none                                      Discharge diagnosis:  postpartum preeclampsia                                                                                                 Post partum procedures: received magnesium sulfate   Hospital course:  Brooke Manciasis a 25y.o. [id number]/p NSVD on 04/16/21- admitted due to postpartum preeclampsia.  [facility] with worsening HA, elevated BPs and noted to have elevated LFTs. She still reports headache currently, but feels like it's a bit better than before.  Currently a 2/10.  Notes intermittent blurry vision, denies this problem currently.  Denies CP, SOB or RUQ pain.  Lochia minimal.  Ambulating and voiding without difficulty.  No nausea/vomiting.  No other acute complaints            Past Medical History:  Diagnosis Date   Anemia               Past Surgical History:  Procedure Laterality Date   NO PAST SURGERIES                                       OB History  Gravida Para Term Preterm AB Living  _0 0   4  SAB IAB Ectopic Multiple Live Births        0 0 0 0 4           # Outcome Date GA Lbr Len/2nd Weight Sex Delivery Anes PTL Lv  4 Term 04/16/21 312w2d1:26 / 00:05 3436 g F Vag-Spont None   LIV  3 Term 10/24/19 407w6d:02 / 00:06 2716 g F Vag-Spont None   LIV  2 Term 2014 [id number] g F Vag-Spont Local   LIV     Birth Comments: no complications  1 Term 09/059/74/16171w1d3 / 00:36 2995 g F Vag-Spont Local   LIV     Birth Comments: no anomalies or problems  Patient denies  any other pertinent gynecologic issues.  No current facility-administered medications on file prior to encounter.               Current Outpatient Medications on File Prior to Encounter  Medication Sig Dispense Refill   acetaminophen (TYLENOL) 500 MG tablet Take 2 tablets (1,000  mg total) by mouth every 8 (eight) hours as needed (pain). (Patient taking differently: Take 500 mg by mouth every 8 (eight) hours as needed (for pain).) 60 tablet 0   ibuprofen (ADVIL) 600 MG tablet Take 1 tablet (600 mg total) by mouth every 6 (six) hours as needed (pain). 40 tablet 0   Blood Pressure Monitoring (BLOOD PRESSURE KIT) DEVI 1 Device by Does not apply route as needed. ICD 10 Z34.90 (Patient not taking: No sig reported) 1 Device 0   Prenatal Vit-Fe Fumarate-FA (PRENATAL VITAMIN) 27-0.8 MG TABS Take 1 tablet by mouth daily. (Patient not taking: Reported on 04/22/2021) 30 tablet 11    No Known Allergies   Social History:   reports that she has never smoked. She has never used smokeless tobacco. She reports that she does not drink alcohol and does not use drugs.          Family History  Problem Relation Age of Onset   Cancer Sister     Diabetes Paternal Grandmother     Cancer Paternal Grandmother        Review of Systems: Pertinent items noted in HPI and remainder of comprehensive ROS otherwise negative.   PHYSICAL EXAM: Blood pressure 140/88, pulse 90, temperature 98 F (36.7 C), temperature source Oral, resp. rate 16, height _0  (1.6 m), weight 56.7 kg, last menstrual period 07/15/2020, SpO2 98 %, unknown if currently breastfeeding. CONSTITUTIONAL: Well-developed, well-nourished female in no acute distress.  EYES: Conjunctivae and EOM are normal. Pupils are equal, round, and reactive to light. No scleral icterus.  NECK: Normal range of motion, supple, no masses SKIN: Skin is warm and dry. No rash noted. Not diaphoretic. No erythema. No pallor. PSYCHIATRIC: Normal mood and affect. Normal behavior.  Normal judgment and thought content. CARDIOVASCULAR: Normal heart rate noted, regular rhythm RESPIRATORY: Effort and breath sounds normal, no problems with respiration noted ABDOMEN: Soft, nontender, nondistended. EXT: no edema, no calf tenderness Neuro: DTRs 2+, no clonus   Labs:            Results for orders placed or performed during the hospital encounter of 04/22/21 (from the past 336 hour(s))  CBC with Differential    Collection Time: 04/22/21 11:21 PM  Result Value Ref Range    WBC 8.6 4.0 - 10.5 K/uL    RBC 4.05 3.87 - 5.11 MIL/uL    Hemoglobin 11.3 (L) 12.0 - 15.0 g/dL    HCT 35.4 (L) 36.0 - 46.0 %    MCV 87.4 80.0 - 100.0 fL    MCH 27.9 26.0 - 34.0 pg    MCHC 31.9 30.0 - 36.0 g/dL    RDW 17.5 (H) 11.5 - 15.5 %    Platelets 211 150 - 400 K/uL    nRBC 0.0 0.0 - 0.2 %    Neutrophils Relative % 65 %    Neutro Abs 5.8 1.7 - 7.7 K/uL    Lymphocytes Relative 25 %    Lymphs Abs 2.1 0.7 - 4.0 K/uL    Monocytes Relative 6 %    Monocytes Absolute 0.5 0.1 - 1.0 K/uL    Eosinophils Relative 2 %    Eosinophils Absolute 0.2 0.0 - 0.5 K/uL    Basophils Relative 1 %    Basophils Absolute 0.0 0.0 - 0.1 K/uL    Immature Granulocytes 1 %    Abs Immature Granulocytes 0.04 0.00 - 0.07 K/uL  Comprehensive metabolic panel    Collection Time: 04/22/21 11:21 PM  Result Value Ref  Range    Sodium 138 135 - 145 mmol/L    Potassium 3.8 3.5 - 5.1 mmol/L    Chloride 103 98 - 111 mmol/L    CO2 23 22 - 32 mmol/L    Glucose, Bld 92 70 - 99 mg/dL    BUN 18 6 - 20 mg/dL    Creatinine, Ser 0.74 0.44 - 1.00 mg/dL    Calcium 9.1 8.9 - 10.3 mg/dL    Total Protein 6.9 6.5 - 8.1 g/dL    Albumin 3.3 (L) 3.5 - 5.0 g/dL    AST 248 (H) 15 - 41 U/L    ALT 142 (H) 0 - 44 U/L    Alkaline Phosphatase 99 38 - 126 U/L    Total Bilirubin 0.5 0.3 - 1.2 mg/dL    GFR, Estimated >60 >60 mL/min    Anion gap 12 5 - 15  Urinalysis, Routine w reflex microscopic Urine, Unspecified Source    Collection Time:  04/22/21 11:21 PM  Result Value Ref Range    Color, Urine YELLOW (A) YELLOW    APPearance CLEAR (A) CLEAR    Specific Gravity, Urine <1.005 (L) 1.005 - 1.030    pH 6.0 5.0 - 8.0    Glucose, UA NEGATIVE NEGATIVE mg/dL    Hgb urine dipstick LARGE (A) NEGATIVE    Bilirubin Urine NEGATIVE NEGATIVE    Ketones, ur NEGATIVE NEGATIVE mg/dL    Protein, ur NEGATIVE NEGATIVE mg/dL    Nitrite NEGATIVE NEGATIVE    Leukocytes,Ua MODERATE (A) NEGATIVE    RBC / HPF 6-10 0 - 5 RBC/hpf    WBC, UA 6-10 0 - 5 WBC/hpf    Bacteria, UA RARE (A) NONE SEEN    Squamous Epithelial / LPF 0-5 0 - 5    Mucus PRESENT    Resp Panel by RT-PCR (Flu A&B, Covid) Nasopharyngeal Swab    Collection Time: 04/23/21 12:50 AM    Specimen: Nasopharyngeal Swab; Nasopharyngeal(NP) swabs in vial transport medium  Result Value Ref Range    SARS Coronavirus 2 by RT PCR NEGATIVE NEGATIVE    Influenza A by PCR NEGATIVE NEGATIVE    Influenza B by PCR NEGATIVE NEGATIVE  Results for orders placed or performed during the hospital encounter of 04/15/21 (from the past 336 hour(s))  CBC    Collection Time: 04/15/21  2:39 PM  Result Value Ref Range    WBC 8.4 4.0 - 10.5 K/uL    RBC 3.79 (L) 3.87 - 5.11 MIL/uL    Hemoglobin 10.5 (L) 12.0 - 15.0 g/dL    HCT 32.1 (L) 36.0 - 46.0 %    MCV 84.7 80.0 - 100.0 fL    MCH 27.7 26.0 - 34.0 pg    MCHC 32.7 30.0 - 36.0 g/dL    RDW 16.4 (H) 11.5 - 15.5 %    Platelets 157 150 - 400 K/uL    nRBC 0.2 0.0 - 0.2 %  Comprehensive metabolic panel    Collection Time: 04/15/21  2:39 PM  Result Value Ref Range    Sodium 134 (L) 135 - 145 mmol/L    Potassium 3.9 3.5 - 5.1 mmol/L    Chloride 101 98 - 111 mmol/L    CO2 22 22 - 32 mmol/L    Glucose, Bld 75 70 - 99 mg/dL    BUN 9 6 - 20 mg/dL    Creatinine, Ser 0.85 0.44 - 1.00 mg/dL    Calcium 8.7 (L) 8.9 - 10.3 mg/dL    Total  Protein 6.2 (L) 6.5 - 8.1 g/dL    Albumin 2.8 (L) 3.5 - 5.0 g/dL    AST 25 15 - 41 U/L    ALT 13 0 - 44 U/L    Alkaline  Phosphatase 159 (H) 38 - 126 U/L    Total Bilirubin 0.7 0.3 - 1.2 mg/dL    GFR, Estimated >60 >60 mL/min    Anion gap 11 5 - 15  Type and screen [place]    Collection Time: 04/15/21  2:40 PM  Result Value Ref Range    ABO/RH(D) A POS      Antibody Screen NEG      Sample Expiration          04/18/2021,2359 Performed at [facility], [place] [address], [place], [place] [address]    Protein / creatinine ratio, urine    Collection Time: 04/15/21  3:18 PM  Result Value Ref Range    Creatinine, Urine 84.63 mg/dL    Total Protein, Urine 8 mg/dL    Protein Creatinine Ratio 0.09 0.00 - 0.15 mg/mg[Cre]  Resp Panel by RT-PCR (Flu A&B, Covid) Nasopharyngeal Swab    Collection Time: 04/15/21  3:41 PM    Specimen: Nasopharyngeal Swab; Nasopharyngeal(NP) swabs in vial transport medium  Result Value Ref Range    SARS Coronavirus 2 by RT PCR NEGATIVE NEGATIVE    Influenza A by PCR NEGATIVE NEGATIVE    Influenza B by PCR NEGATIVE NEGATIVE  RPR    Collection Time: 04/15/21  3:51 PM  Result Value Ref Range    RPR Ser Ql NON REACTIVE NON REACTIVE  CBC    Collection Time: 04/16/21  5:32 AM  Result Value Ref Range    WBC 13.6 (H) 4.0 - 10.5 K/uL    RBC 3.61 (L) 3.87 - 5.11 MIL/uL    Hemoglobin 10.2 (L) 12.0 - 15.0 g/dL    HCT 30.5 (L) 36.0 - 46.0 %    MCV 84.5 80.0 - 100.0 fL    MCH 28.3 26.0 - 34.0 pg    MCHC 33.4 30.0 - 36.0 g/dL    RDW 16.6 (H) 11.5 - 15.5 %    Platelets 138 (L) 150 - 400 K/uL    nRBC 0.0 0.0 - 0.2 %      Imaging Studies:   Imaging Results  CT Head Wo Contrast   Result Date: 04/22/2021 CLINICAL DATA:  Headache high blood pressure EXAM: CT HEAD WITHOUT CONTRAST TECHNIQUE: Contiguous axial images were obtained from the base of the skull through the vertex without intravenous contrast. COMPARISON:  None. FINDINGS: Brain: No acute territorial infarction, hemorrhage or intracranial mass. The ventricles are nonenlarged. Vascular: No hyperdense vessel  or unexpected calcification. Skull: Normal. Negative for fracture or focal lesion. Sinuses/Orbits: No acute finding. Other: None IMPRESSION: Negative non contrasted CT appearance of the brain Electronically Signed   By: Donavan Foil M.D.   On: 04/22/2021 23:32        Assessment: Active Problems:   Preeclampsia in postpartum period     Plan: -IV Mag x 24hr -start Procardia XL 65m daily, close monitoring of BP -labs as above, repeat lab work later today -routine postpartum care  Patient did well after receiving IV magnesium and antihypertensives, discharged home stable   Physical exam        Vitals:    04/24/21 0010 04/24/21 0348 04/24/21 0759 04/24/21 1150  BP: 114/70 110/64 113/71 114/67  Pulse: 79 90 77 71  Resp: 18  _0 Temp: 98 F (36.7 C) 98.4 F (36.9 C) 98.3 F (36.8 C) 98.3 F (36.8 C)  TempSrc: Oral Oral Oral Oral  SpO2: 100% 97% 99% 99%  Weight:          Height:            General: alert, cooperative, and no distress Lochia: appropriate Uterine Fundus: firm DVT Evaluation: No evidence of DVT seen on physical exam. Labs: Recent Labs       Lab Results  Component Value Date    WBC 7.9 04/23/2021    HGB 12.4 04/23/2021    HCT 37.3 04/23/2021    MCV 85.6 04/23/2021    PLT 255 04/23/2021      CMP Latest Ref Rng & Units 04/23/2021  Glucose 70 - 99 mg/dL 103(H)  BUN 6 - 20 mg/dL 10  Creatinine 0.44 - 1.00 mg/dL 0.71  Sodium 135 - 145 mmol/L 134(L)  Potassium 3.5 - 5.1 mmol/L 4.0  Chloride 98 - 111 mmol/L 98  CO2 22 - 32 mmol/L 25  Calcium 8.9 - 10.3 mg/dL 7.2(L)  Total Protein 6.5 - 8.1 g/dL 7.0  Total Bilirubin 0.3 - 1.2 mg/dL 0.6  Alkaline Phos 38 - 126 U/L 104  AST 15 - 41 U/L 279(H)  ALT 0 - 44 U/L 161(H)      Discharge instruction: per After Visit Summary and "Baby and Me Booklet".   After visit meds:  Allergies as of 04/24/2021   No Known Allergies         Medication List       TAKE these medications     acetaminophen 500 MG  tablet Commonly known as: TYLENOL Take 2 tablets (1,000 mg total) by mouth every 8 (eight) hours as needed (pain). What changed:  how much to take reasons to take this    Blood Pressure Kit Devi 1 Device by Does not apply route as needed. ICD 10 Z34.90    ibuprofen 600 MG tablet Commonly known as: ADVIL Take 1 tablet (600 mg total) by mouth every 6 (six) hours as needed (pain).    NIFEdipine 30 MG 24 hr tablet Commonly known as: ADALAT CC Take 1 tablet (30 mg total) by mouth daily. Start taking on: April 25, 2021    Prenatal Vitamin 27-0.8 MG Tabs Take 1 tablet by mouth daily.             Diet: routine diet   Activity: Advance as tolerated. Pelvic rest for 6 weeks.    Outpatient follow up:2 weeks Follow up Appt:       Future Appointments  Date Time Provider [place]  05/19/2021  8:15 AM Patriciaann Clan, DO [facility] [place]    Follow up Visit:No follow-ups on file.       Newborn Data: Live born female  Birth Weight: 7 lb 9.2 oz (3436 g) APGAR: 9, 9   Newborn Delivery   Birth date/time: 04/16/2021 00:31:00 Delivery type: Vaginal, Spontaneous            04/24/2021 Emeterio Reeve, MD

## 2021-04-28 ENCOUNTER — Telehealth (HOSPITAL_COMMUNITY): Payer: Self-pay | Admitting: *Deleted

## 2021-04-28 NOTE — Telephone Encounter (Signed)
Patient voiced no questions or concerns regarding her own health. EPDS = 1. Patient voiced no questions or concerns regarding baby at this time. Patient reported infant sleeps in a bassinet on her back. RN reviewed ABCs of safe sleep - patient verbalized understanding. Patient requested RN email information on hospital's virtual Baby and Me class. Email sent. Deforest Hoyles, RN, 04/28/21, 1705.

## 2021-04-29 ENCOUNTER — Ambulatory Visit: Payer: Medicaid Other

## 2021-05-19 ENCOUNTER — Encounter: Payer: Self-pay | Admitting: Family Medicine

## 2021-05-19 ENCOUNTER — Ambulatory Visit (INDEPENDENT_AMBULATORY_CARE_PROVIDER_SITE_OTHER): Payer: Medicaid Other | Admitting: Family Medicine

## 2021-05-19 ENCOUNTER — Other Ambulatory Visit: Payer: Self-pay

## 2021-05-19 DIAGNOSIS — Z23 Encounter for immunization: Secondary | ICD-10-CM | POA: Diagnosis not present

## 2021-05-19 DIAGNOSIS — Z8759 Personal history of other complications of pregnancy, childbirth and the puerperium: Secondary | ICD-10-CM | POA: Insufficient documentation

## 2021-05-19 DIAGNOSIS — O1495 Unspecified pre-eclampsia, complicating the puerperium: Secondary | ICD-10-CM

## 2021-05-19 DIAGNOSIS — F419 Anxiety disorder, unspecified: Secondary | ICD-10-CM

## 2021-05-19 NOTE — Patient Instructions (Addendum)
Stop your procardia. Please check your blood pressure at home atleast once daily for the next two weeks and keep a journal of this. Goal is to keep your blood pressure under 130/80. If starting to go back above, please restart the medicine and let me know.   Try to get at least 4 hours of uninterrupted sleep. If despite these changes you are still having headaches, please follow up.

## 2021-05-19 NOTE — Progress Notes (Signed)
Post Partum Visit Note  Brooke Patel is a 25 y.o. [phone] female who presents for a postpartum visit. She is 4 weeks postpartum following a normal spontaneous vaginal delivery without lacerations.  I have fully reviewed the prenatal and intrapartum course. The delivery was at 39 gestational weeks. Anesthesia: IV sedation. Baby is doing well. Baby is feeding by bottle - Similac 360 . Bleeding staining only. Bowel function is normal. Bladder function is normal. Patient is not sexually active. Contraception method is none, still considering IUD but does not want it yet. Postpartum depression screening: negative.  She had gestational HTN during pregnancy and then was readmitted postpartum from 9/29-9/30 pp pre-eclampsia. Given Mg and started on procardia. She has a blood pressure cuff at home but has not been checking her blood pressure. She feels like the procardia makes her feel lightheaded and getting headaches (top of her head, aching without any blurred vision, N/V, extremity weakness/numbness, can do daily tasks still, better with meds but returns), starting taking at night instead to help. Not sure if her BP has been low. Waking up every 2 hours with the infant so not sleeping well. Reports general anxiety that she feels she is managing well with, seeks comfort in her support people.   The pregnancy intention screening data noted above was reviewed. Potential methods of contraception were discussed. The patient elected to proceed with No data recorded.   Edinburgh Postnatal Depression Scale - 05/19/21 0832       Edinburgh Postnatal Depression Scale:  In the Past 7 Days   I have been able to laugh and see the funny side of things. 0    I have looked forward with enjoyment to things. 0    I have blamed myself unnecessarily when things went wrong. 0    I have been anxious or worried for no good reason. 2    I have felt scared or panicky for no good reason. 0    Things have been getting on  top of me. 0    I have been so unhappy that I have had difficulty sleeping. 0    I have felt sad or miserable. 0    I have been so unhappy that I have been crying. 0    The thought of harming myself has occurred to me. 0    Edinburgh Postnatal Depression Scale Total 2             Review of Systems Pertinent items are noted in HPI.  Objective:  BP 120/82   Pulse 77   Wt 136 lb 9.6 oz (62 kg)   LMP 07/15/2020   Breastfeeding No   BMI 24.20 kg/m    General:  alert, cooperative, and no distress   Breasts:  not indicated  Lungs: Normal WOB on RA  Heart:  regular rate and rhythm normal bilateral radial pulses   Abdomen: Soft, nontender                         Neuro CN2-12 intact. Normal speech. Follows complex commands. 5/5 upper and lower extremity strength. Sensation to light touch intact. Normal gait.   GU exam:  not indicated        Assessment:   Normal postpartum exam.  History of postpartum pre-eclampsia  Generalized Anxiety   Plan:   Essential components of care per ACOG recommendations:  1.  Mood and well being: Patient with negative depression screening today. Reviewed local  resources for support.  - Patient tobacco use? No.   - hx of drug use? No.    2. Infant care and feeding:  -Patient currently breastmilk feeding? No.  -Social determinants of health (SDOH) reviewed in EPIC. No concerns.  3. Sexuality, contraception and birth spacing - Reviewed forms of contraception in tiered fashion. Patient is considering the IUD, does not want this placed today. Encouraged her to follow up for placement prior to returning to sexual activity.  - Discussed birth spacing of 18 months  4. Sleep and fatigue -Encouraged family/partner/community support of 4 hrs of uninterrupted sleep to help with mood and fatigue  5. Physical Recovery  - Discussed patients delivery and complications. She describes her labor as good. - Patient had a Vaginal, no problems at delivery.  Patient had no lacerations. Perineal healing reviewed. Patient expressed understanding - Patient has urinary incontinence? No. - Patient is safe to resume physical activity  6.  Health Maintenance - HM due items addressed: due to for flu shot, given today  - Last pap smear: 08/06/2020: NILM  -Breast Cancer screening indicated? No.   7. Chronic Disease/Pregnancy Condition follow up: Hypertension  BP normal today, last took procardia yesterday. Will trial off medication d/t SE, she is to check her BP daily for the next two weeks to ensure it remains normal. Instructed of goal <130/80, if rising may restart and inform us. If still having difficulty with lightheadedness/HA despite discontinuation and improved sleep pattern, should follow up. Neurologically intact. Aware of MAU precautions.   8. History of Transaminitis:   Associated with her postpartum pre-eclampsia, recheck CMP. No abdominal pain.   9. Generalized Anxiety:  Provided supportive listening. Fortunately has good support system at home. Declines behavioral health referral or medication. Discussed breathing techniques and meditation.   Follow up with PCP for routine care, follow up for IUD placement if desires.   Allayne Stack, DO [facility] [employer], [facility]

## 2021-05-19 NOTE — Progress Notes (Signed)
Flu vaccine administered into right arm without any complications. There were no questions or concerns.    Dawayne Patricia, CMA  05/19/21  9:06am

## 2021-05-19 NOTE — Progress Notes (Deleted)
    Post Partum Visit Note  Brooke Patel is a 25 y.o. [phone] female who presents for a postpartum visit. She is 4 weeks postpartum following a normal spontaneous vaginal delivery.  I have fully reviewed the prenatal and intrapartum course. The delivery was at 39.2 gestational weeks.  Anesthesia: IV sedation. Postpartum course has been ***. Baby is doing well***. Baby is feeding by bottle - Similac 360 . Bleeding staining only. Bowel function is normal. Bladder function is normal. Patient is not sexually active. Contraception method is none. Postpartum depression screening: negative.   The pregnancy intention screening data noted above was reviewed. Potential methods of contraception were discussed. The patient elected to proceed with No data recorded.    Health Maintenance Due  Topic Date Due   INFLUENZA VACCINE  02/23/2021    {Common ambulatory SmartLinks:19316}  Review of Systems {ros; complete:30496}  Objective:  LMP 07/15/2020    General:  {gen appearance:16600}   Breasts:  {desc; normal/abnormal/not indicated:14647}  Lungs: {lung exam:16931}  Heart:  {heart exam:5510}  Abdomen: {abdomen exam:16834}   Wound {Wound assessment:11097}  GU exam:  {desc; normal/abnormal/not indicated:14647}       Assessment:    1. Encounter for postpartum visit ***  2. History of gestational hypertension ***  3. Preeclampsia in postpartum period ***   *** postpartum exam.   Plan:   Essential components of care per ACOG recommendations:  1.  Mood and well being: Patient with {gen negative/positive:315881} depression screening today. Reviewed local resources for support.  - Patient tobacco use? {tobacco use:25506}  - hx of drug use? {yes/no:25505}    2. Infant care and feeding:  -Patient currently breastmilk feeding? {yes/no:25502}  -Social determinants of health (SDOH) reviewed in EPIC. No concerns***The following needs were identified***  3. Sexuality, contraception and  birth spacing - Patient {DOES_DOES HEN:27782} want a pregnancy in the next year.  Desired family size is {NUMBER 1-10:22536} children.  - Reviewed forms of contraception in tiered fashion. Patient desired {PLAN CONTRACEPTION:313102} today.   - Discussed birth spacing of 18 months  4. Sleep and fatigue -Encouraged family/partner/community support of 4 hrs of uninterrupted sleep to help with mood and fatigue  5. Physical Recovery  - Discussed patients delivery and complications. She describes her labor as {description:25511} - Patient had a {CHL AMB DELIVERY:[phone]}. Patient had a {laceration:25518} laceration. Perineal healing reviewed. Patient expressed understanding - Patient has urinary incontinence? {yes/no:25515} - Patient {ACTION; IS/IS UMP:53614431} safe to resume physical and sexual activity  6.  Health Maintenance - HM due items addressed {Yes or If no, why not?:20788} - Last pap smear  Diagnosis  Date Value Ref Range Status  03/01/2017   Final   NEGATIVE FOR INTRAEPITHELIAL LESIONS OR MALIGNANCY. BENIGN REACTIVE/REPARATIVE CHANGES.   Pap smear {done:10129} at today's visit.  -Breast Cancer screening indicated? {indicated:25516}  7. Chronic Disease/Pregnancy Condition follow up: {Follow up:25499}  - PCP follow up  Guy Begin, CMA [facility] [employer], [facility]

## 2021-05-20 LAB — COMPREHENSIVE METABOLIC PANEL
ALT: 91 IU/L — ABNORMAL HIGH (ref 0–32)
AST: 94 IU/L — ABNORMAL HIGH (ref 0–40)
Albumin/Globulin Ratio: 1.8 (ref 1.2–2.2)
Albumin: 4.4 g/dL (ref 3.9–5.0)
Alkaline Phosphatase: 69 IU/L (ref 44–121)
BUN/Creatinine Ratio: 18 (ref 9–23)
BUN: 14 mg/dL (ref 6–20)
Bilirubin Total: 0.3 mg/dL (ref 0.0–1.2)
CO2: 23 mmol/L (ref 20–29)
Calcium: 9 mg/dL (ref 8.7–10.2)
Chloride: 102 mmol/L (ref 96–106)
Creatinine, Ser: 0.76 mg/dL (ref 0.57–1.00)
Globulin, Total: 2.5 g/dL (ref 1.5–4.5)
Glucose: 96 mg/dL (ref 70–99)
Potassium: 3.9 mmol/L (ref 3.5–5.2)
Sodium: 139 mmol/L (ref 134–144)
Total Protein: 6.9 g/dL (ref 6.0–8.5)
eGFR: 110 mL/min/{1.73_m2} (ref >59)

## 2021-05-20 LAB — CBC
Hematocrit: 36.8 % (ref 34.0–46.6)
Hemoglobin: 12.3 g/dL (ref 11.1–15.9)
MCH: 29.2 pg (ref 26.6–33.0)
MCHC: 33.4 g/dL (ref 31.5–35.7)
MCV: 87 fL (ref 79–97)
Platelets: 187 10*3/uL (ref 150–450)
RBC: 4.21 x10E6/uL (ref 3.77–5.28)
RDW: 15.7 % — ABNORMAL HIGH (ref 11.7–15.4)
WBC: 6.8 10*3/uL (ref 3.4–10.8)

## 2021-06-27 ENCOUNTER — Ambulatory Visit
Admission: RE | Admit: 2021-06-27 | Discharge status: Against Medical Advice | Payer: Medicaid Other | Source: Ambulatory Visit

## 2022-03-13 IMAGING — CT CT HEAD W/O CM
3 series · 14 of 45 positions shown, 16 images · non-contrast
Comparison: None.

CLINICAL DATA: Headache high blood pressure

EXAM:
CT HEAD WITHOUT CONTRAST
TECHNIQUE: Contiguous axial images were obtained from the base of the skull
through the vertex without intravenous contrast.

[Series 2: head wo · axial · 0.47mm/px · z∈[-146,-31]mm · 8 of 28 slices shown, 10 images]
[im 3/28  brain]
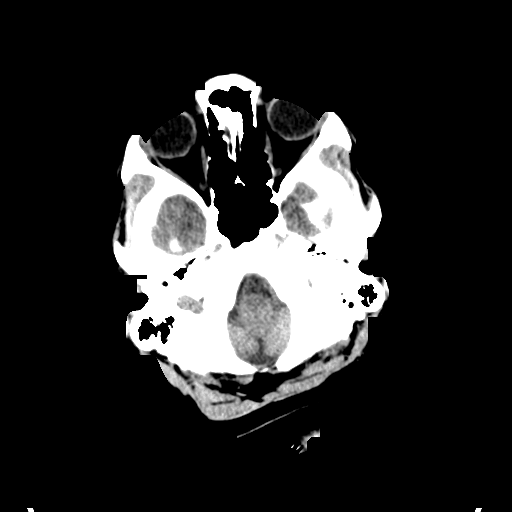
[im 3/28  bone]
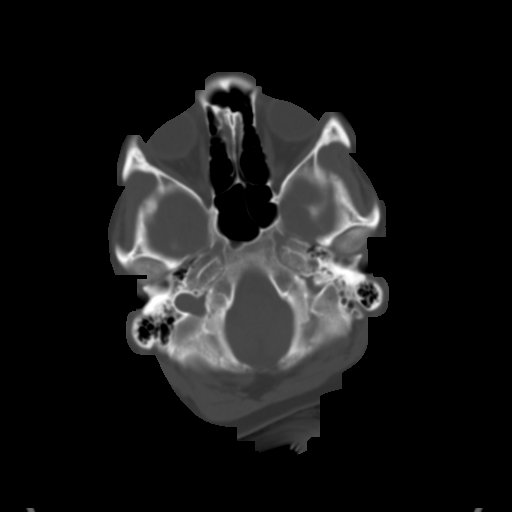
[im 6/28  brain]
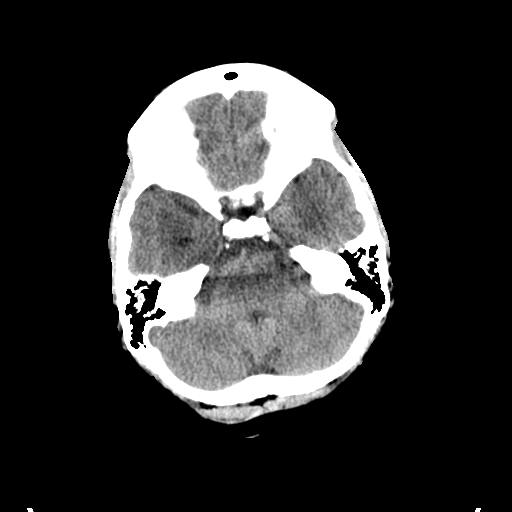
[im 10/28  brain]
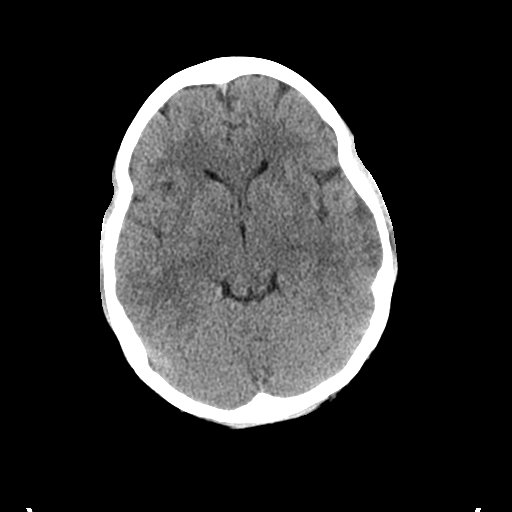
[im 13/28  brain]
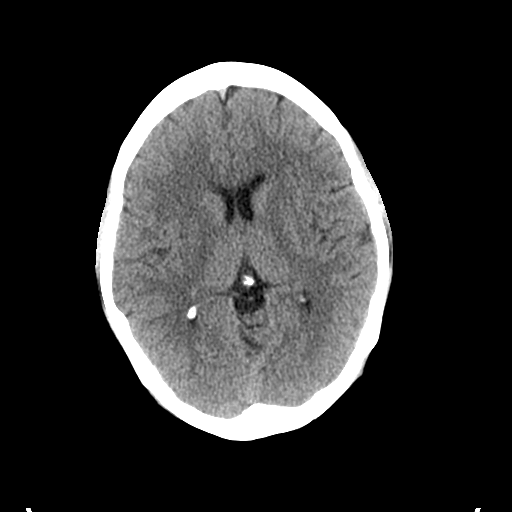
[im 16/28  brain]
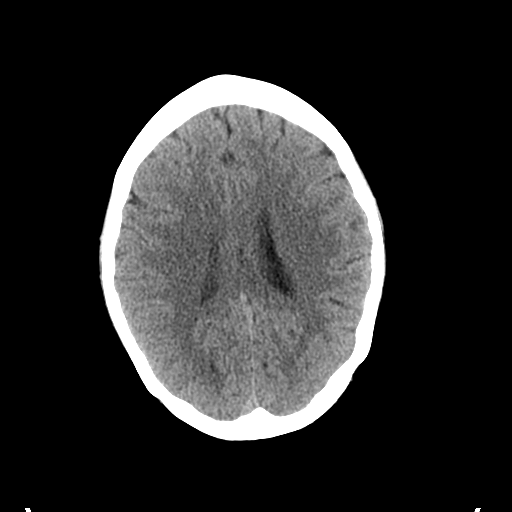
[im 16/28  bone]
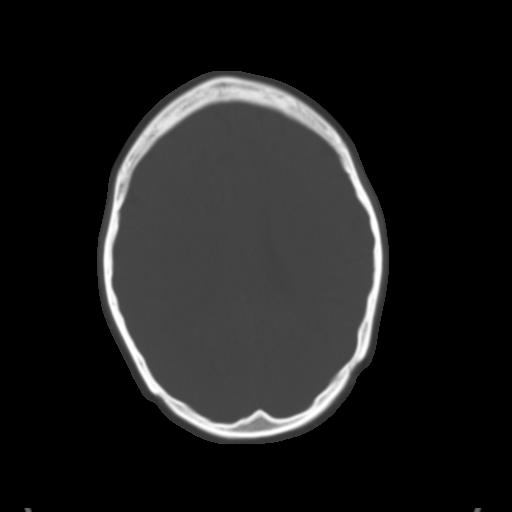
[im 19/28  brain]
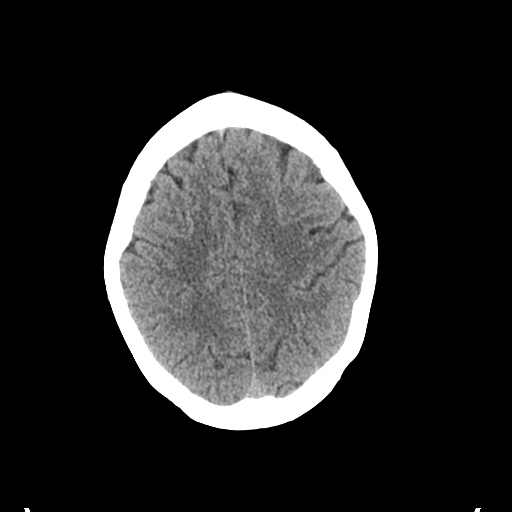
[im 23/28  brain]
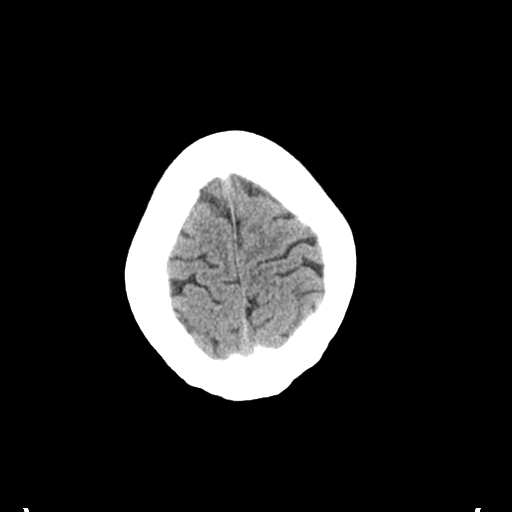
[im 26/28  brain]
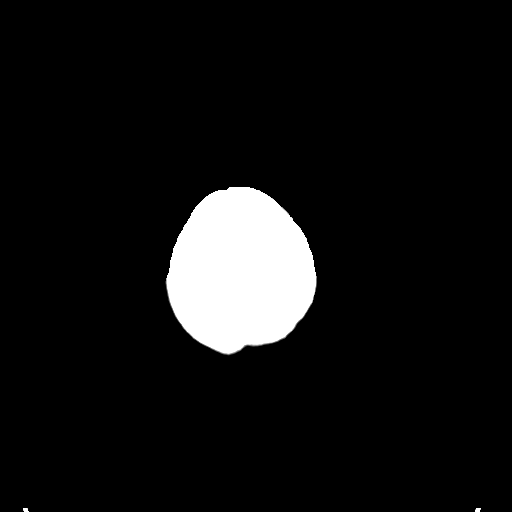

[Series 4: coronal soft tissue · coronal · 0.31mm/px · 3 of 61 slices shown]
[im 21/61  brain]
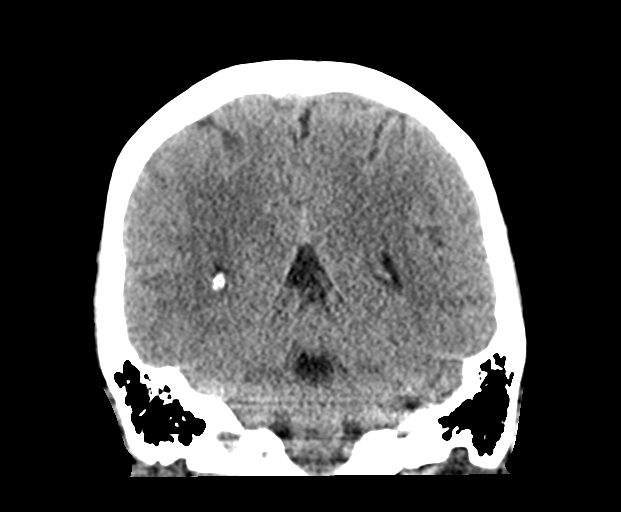
[im 27/61  brain]
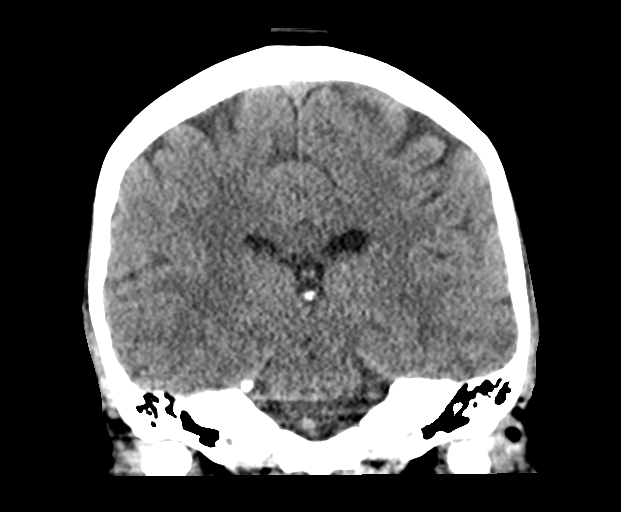
[im 34/61  brain]
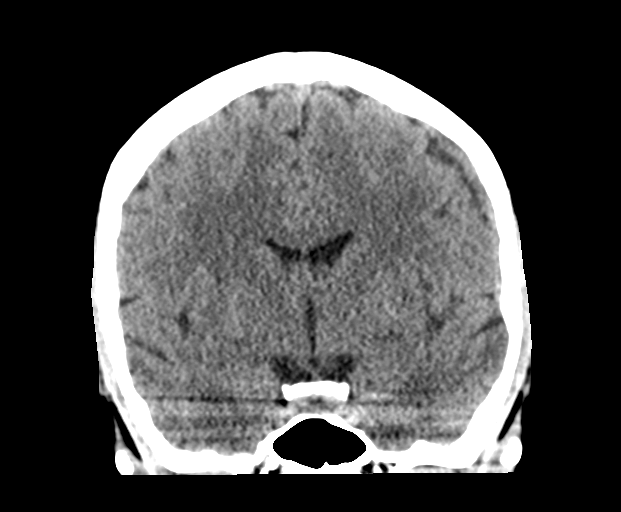

[Series 5: sagittal soft tissue · sagittal · 0.29mm/px · 3 of 50 slices shown]
[im 17/50  brain]
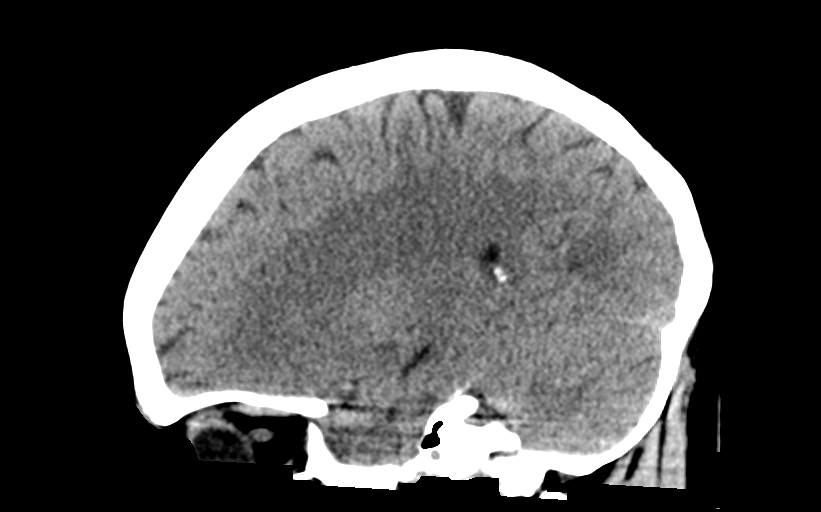
[im 25/50  brain]
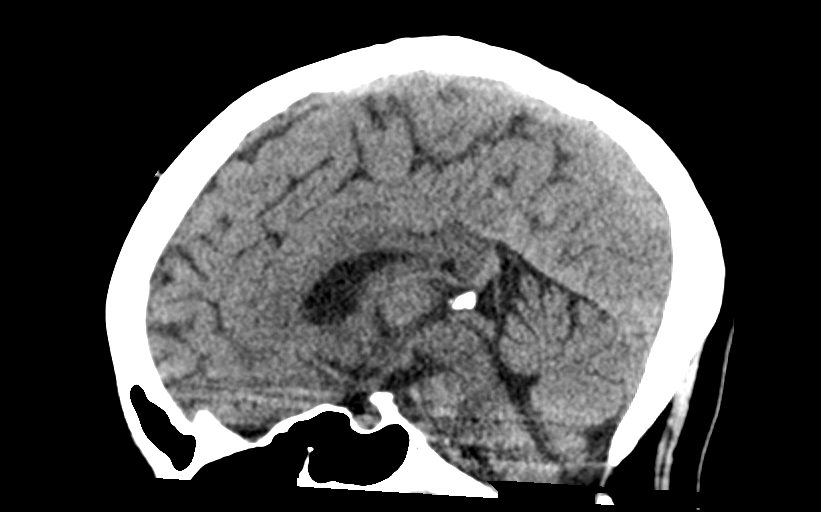
[im 33/50  brain]
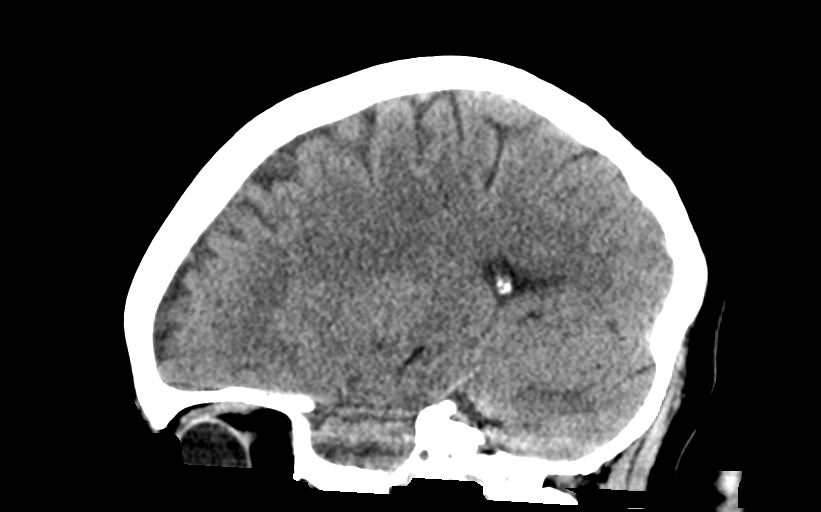

[14 of 45 positions shown; findings below may reference images not displayed]

FINDINGS: Brain: No acute territorial infarction, hemorrhage or intracranial
mass. The ventricles are nonenlarged.

Vascular: No hyperdense vessel or unexpected calcification.

Skull: Normal. Negative for fracture or focal lesion.

Sinuses/Orbits: No acute finding.

Other: None
IMPRESSION: Negative non contrasted CT appearance of the brain

## 2022-07-26 NOTE — L&D Delivery Note (Signed)
   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    CNM called to MAU room patient 8cm. Patient in control and breathing well through contractions. Patient arrived safely to Rm 214 on L&D. Upon transfer from stretcher to Bed, patient involuntarily bearing down. With great maternal pushing efforts fetal head and body delivered without difficulty. Vigorously crying infant placed immediately skin to skin with patient  up to  Pushing in lithotomy position with CNM and L&D staff support.   Delivery of a Live born female  Birth Weight:  PENDING  APGAR: 9,10   Newborn Delivery   Birth date/time: 04/20/2023 02:07:00 Delivery type: Vaginal, Spontaneous     Fetal head delivered in cephalic presentation, position DOA and spontaneously restituted to ROT.  Nuchal Cord: No    After 5 mins of life cord double clamped after cessation of pulsation, and cut by FOB "Donald Pore."  Collection of cord blood for typing completed. Cord blood donation-None Arterial cord blood sample-No   Third Stage:  With gentle cord traction Placenta delivered-Spontaneous intact with 3 vessels. Uterine  tone firm  bleeding minimal Uterotonics: IM Pit given  Placenta to L&D for disposal.    No laceration identified.  Episiotomy:None Local analgesia: N/A   Repair:N/A  Est. Blood Loss (mL):107.00  Complications: None   Mom to postpartum.  Baby girl "unknown" to Couplet care / Skin to Skin.  Delivery Report:  Review the Delivery Report for details.    Catrena Vari Danella Deis) Suzie Portela, MSN, CNM  Center for [facility]  04/20/23 2:28 AM

## 2022-09-22 ENCOUNTER — Ambulatory Visit: Payer: Medicaid Other | Admitting: *Deleted

## 2022-09-22 ENCOUNTER — Other Ambulatory Visit (HOSPITAL_COMMUNITY)
Admission: RE | Admit: 2022-09-22 | Discharge: 2022-09-22 | Disposition: A | Discharge status: Home / Self Care | Payer: Medicaid Other | Source: Ambulatory Visit | Attending: Obstetrics | Admitting: Obstetrics

## 2022-09-22 ENCOUNTER — Ambulatory Visit (INDEPENDENT_AMBULATORY_CARE_PROVIDER_SITE_OTHER): Payer: Medicaid Other

## 2022-09-22 VITALS — BP 116/78 | HR 71 | Ht 63.0 in | Wt 144.1 lb

## 2022-09-22 DIAGNOSIS — Z3480 Encounter for supervision of other normal pregnancy, unspecified trimester: Secondary | ICD-10-CM

## 2022-09-22 DIAGNOSIS — Z3481 Encounter for supervision of other normal pregnancy, first trimester: Secondary | ICD-10-CM | POA: Diagnosis not present

## 2022-09-22 DIAGNOSIS — Z3A09 9 weeks gestation of pregnancy: Secondary | ICD-10-CM | POA: Diagnosis not present

## 2022-09-22 DIAGNOSIS — O3680X0 Pregnancy with inconclusive fetal viability, not applicable or unspecified: Secondary | ICD-10-CM

## 2022-09-22 HISTORY — DX: Encounter for supervision of other normal pregnancy, unspecified trimester: Z34.80

## 2022-09-22 MED ORDER — COMPLETENATE 29-1 MG PO CHEW
1.0000 | CHEWABLE_TABLET | Freq: Every day | ORAL | 11 refills | Status: AC
Start: 2022-09-22 — End: ?

## 2022-09-22 MED ORDER — PROMETHAZINE HCL 25 MG PO TABS
25.0000 mg | ORAL_TABLET | Freq: Four times a day (QID) | ORAL | 1 refills | Status: DC | PRN
Start: 2022-09-22 — End: 2022-10-22

## 2022-09-22 NOTE — Progress Notes (Signed)
New OB Intake  I connected with Brooke Patel  on 09/22/22 at  3:10 PM EST by In Person Visit and verified that I am speaking with the correct person using two identifiers. Nurse is located at [facility] and pt is located at [place].  I discussed the limitations, risks, security and privacy concerns of performing an evaluation and management service by telephone and the availability of in person appointments. I also discussed with the patient that there may be a patient responsible charge related to this service. The patient expressed understanding and agreed to proceed.  I explained I am completing New OB Intake today. We discussed EDD of 04/25/23 that is based on [place] at 51w2don 09/22/22. Pt is G5/P4. I reviewed her allergies, medications, Medical/Surgical/OB history, and appropriate screenings. I informed her of [facility]. [facility]. Based on history, this is a low risk pregnancy.  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 Offered upcoming OB visit with CNM to discuss further.  MyChart/Babyscripts MyChart access verified. I explained pt will have some visits in office and some virtually. Babyscripts instructions given and order placed. Patient verifies receipt of registration text/e-mail. Account successfully created and app downloaded.  Blood Pressure Cuff/Weight Scale Pt has BP cuff from previous pregnancy. Explained  after first prenatal appt pt will check weekly and document in B73 Patient does not have weight scale; patient may purchase if they desire to track weight weekly in Babyscripts.  Anatomy [place]Explained first scheduled [place]awill be around 19 weeks. Anatomy [place]cheduled for 19 wks at MFM. Pt notified to arrive at TBD.  Labs Discussed NJohnsie Cancelgenetic screening with patient. Would like both Panorama and Horizon drawn at new OB visit. Routine prenatal labs needed.  COVID Vaccine Patient has not had COVID vaccine.   Social Determinants of Health Food Insecurity: Patient denies food insecurity. WIC Referral: [facility]  Transportation: Patient denies transportation needs. Childcare: Discussed no children allowed at ultrasound appointments. Offered childcare services; patient declines childcare services at this time.  Interested in [place] If yes, send referral.   First visit review I reviewed new OB appt with patient. I explained they will have a provider visit that includes Pap, discussion with provider. Explained pt will be seen by Dr. CCaron Presumeon at first visit on 10/20/22; encounter routed to appropriate provider. Explained that patient will be seen by pregnancy navigator following visit with provider.   EPenny Pia RN 09/22/2022  3:11 PM

## 2022-09-23 ENCOUNTER — Other Ambulatory Visit: Payer: Self-pay | Admitting: *Deleted

## 2022-09-23 DIAGNOSIS — Z3480 Encounter for supervision of other normal pregnancy, unspecified trimester: Secondary | ICD-10-CM

## 2022-09-23 LAB — COMPREHENSIVE METABOLIC PANEL
ALT: 8 IU/L (ref 0–32)
AST: 13 IU/L (ref 0–40)
Albumin/Globulin Ratio: 1.4 (ref 1.2–2.2)
Albumin: 4.3 g/dL (ref 4.0–5.0)
Alkaline Phosphatase: 53 IU/L (ref 44–121)
BUN/Creatinine Ratio: 9 (ref 9–23)
BUN: 5 mg/dL — ABNORMAL LOW (ref 6–20)
Bilirubin Total: 0.2 mg/dL (ref 0.0–1.2)
CO2: 24 mmol/L (ref 20–29)
Calcium: 9.7 mg/dL (ref 8.7–10.2)
Chloride: 100 mmol/L (ref 96–106)
Creatinine, Ser: 0.54 mg/dL — ABNORMAL LOW (ref 0.57–1.00)
Globulin, Total: 3 g/dL (ref 1.5–4.5)
Glucose: 71 mg/dL (ref 70–99)
Potassium: 4.1 mmol/L (ref 3.5–5.2)
Sodium: 137 mmol/L (ref 134–144)
Total Protein: 7.3 g/dL (ref 6.0–8.5)
eGFR: 129 mL/min/{1.73_m2} (ref >59)

## 2022-09-23 LAB — CBC/D/PLT+RPR+RH+ABO+RUBIGG...
Antibody Screen: NEGATIVE
Basophils Absolute: 0 10*3/uL (ref 0.0–0.2)
Basos: 0 %
EOS (ABSOLUTE): 0.2 10*3/uL (ref 0.0–0.4)
Eos: 2 %
HCV Ab: NONREACTIVE
HIV Screen 4th Generation wRfx: NONREACTIVE
Hematocrit: 37.8 % (ref 34.0–46.6)
Hemoglobin: 12.4 g/dL (ref 11.1–15.9)
Hepatitis B Surface Ag: NEGATIVE
Immature Grans (Abs): 0 10*3/uL (ref 0.0–0.1)
Immature Granulocytes: 0 %
Lymphocytes Absolute: 2 10*3/uL (ref 0.7–3.1)
Lymphs: 21 %
MCH: 27.8 pg (ref 26.6–33.0)
MCHC: 32.8 g/dL (ref 31.5–35.7)
MCV: 85 fL (ref 79–97)
Monocytes Absolute: 0.6 10*3/uL (ref 0.1–0.9)
Monocytes: 6 %
Neutrophils Absolute: 6.8 10*3/uL (ref 1.4–7.0)
Neutrophils: 71 %
Platelets: 251 10*3/uL (ref 150–450)
RBC: 4.46 x10E6/uL (ref 3.77–5.28)
RDW: 15.1 % (ref 11.7–15.4)
RPR Ser Ql: NONREACTIVE
Rh Factor: POSITIVE
Rubella Antibodies, IGG: 1.12 index (ref >0.99)
WBC: 9.5 10*3/uL (ref 3.4–10.8)

## 2022-09-23 LAB — HCV INTERPRETATION

## 2022-09-24 LAB — CERVICOVAGINAL ANCILLARY ONLY
Chlamydia: NEGATIVE
Comment: NEGATIVE
Comment: NORMAL
Neisseria Gonorrhea: NEGATIVE

## 2022-09-24 LAB — URINE CULTURE, OB REFLEX: Organism ID, Bacteria: NO GROWTH

## 2022-09-24 LAB — CULTURE, OB URINE

## 2022-09-29 LAB — PANORAMA PRENATAL TEST FULL PANEL:PANORAMA TEST PLUS 5 ADDITIONAL MICRODELETIONS: FETAL FRACTION: 11.9

## 2022-10-20 ENCOUNTER — Ambulatory Visit (INDEPENDENT_AMBULATORY_CARE_PROVIDER_SITE_OTHER): Payer: Medicaid Other | Admitting: Family Medicine

## 2022-10-20 ENCOUNTER — Ambulatory Visit (INDEPENDENT_AMBULATORY_CARE_PROVIDER_SITE_OTHER): Payer: Medicaid Other | Admitting: Licensed Clinical Social Worker

## 2022-10-20 ENCOUNTER — Encounter: Payer: Self-pay | Admitting: Family Medicine

## 2022-10-20 ENCOUNTER — Other Ambulatory Visit (HOSPITAL_COMMUNITY)
Admission: RE | Admit: 2022-10-20 | Discharge: 2022-10-20 | Disposition: A | Discharge status: Home / Self Care | Payer: Medicaid Other | Source: Ambulatory Visit | Attending: Family Medicine | Admitting: Family Medicine

## 2022-10-20 VITALS — BP 128/76 | HR 85 | Wt 143.8 lb

## 2022-10-20 DIAGNOSIS — Z3480 Encounter for supervision of other normal pregnancy, unspecified trimester: Secondary | ICD-10-CM

## 2022-10-20 DIAGNOSIS — O09299 Supervision of pregnancy with other poor reproductive or obstetric history, unspecified trimester: Secondary | ICD-10-CM

## 2022-10-20 DIAGNOSIS — Z3A00 Weeks of gestation of pregnancy not specified: Secondary | ICD-10-CM | POA: Diagnosis not present

## 2022-10-20 DIAGNOSIS — Z3A13 13 weeks gestation of pregnancy: Secondary | ICD-10-CM

## 2022-10-20 DIAGNOSIS — Z1339 Encounter for screening examination for other mental health and behavioral disorders: Secondary | ICD-10-CM

## 2022-10-20 DIAGNOSIS — N898 Other specified noninflammatory disorders of vagina: Secondary | ICD-10-CM | POA: Diagnosis present

## 2022-10-20 DIAGNOSIS — O09291 Supervision of pregnancy with other poor reproductive or obstetric history, first trimester: Secondary | ICD-10-CM | POA: Diagnosis not present

## 2022-10-20 NOTE — Progress Notes (Signed)
Pt presents for NOB visit. No concerns.

## 2022-10-20 NOTE — Progress Notes (Signed)
Subjective:   Brooke Patel is a 27 y.o. G5P4004 at [redacted]w[redacted]d by LMP being seen today for her first obstetrical visit.  Her obstetrical history is significant for pre-eclampsia. Patient does not intend to breast feed. Pregnancy history fully reviewed.  Patient reports no bleeding, no contractions, no cramping, and no leaking.  HISTORY: OB History  Gravida Para Term Preterm AB Living  5 4 4  0 0 4  SAB IAB Ectopic Multiple Live Births  0 0 0 0 4    # Outcome Date GA Lbr Len/2nd Weight Sex Delivery Anes PTL Lv  5 Current           4 Term 04/16/21 [redacted]w[redacted]d 01:26 / 00:05 7 lb 9.2 oz (3.436 kg) F Vag-Spont None  LIV     Name: Brooke Patel     Apgar1: 9  Apgar5: 9  3 Term 10/24/19 [redacted]w[redacted]d 09:02 / 00:06 5 lb 15.8 oz (2.716 kg) F Vag-Spont None  LIV     Name: Brooke Patel     Apgar1: 9  Apgar5: 9  2 Term 2014 [redacted]w[redacted]d  6 lb (2.722 kg) F Vag-Spont Local  LIV     Birth Comments: no complications  1 Term [id number] [redacted]w[redacted]d 28:13 / 00:36 6 lb 9.6 oz (2.995 kg) F Vag-Spont Local  LIV     Birth Comments: no anomalies or problems     Name: Brooke Patel     Apgar1: 9  Apgar5: 9   Last pap smear was  03/01/2017 and was normal Past Medical History:  Diagnosis Date   Anemia    Group B Streptococcus carrier, +RV culture, currently pregnant 04/03/2021   Will treat in labor   Hypertension    in pregnancy   Supervision of low-risk pregnancy 04/09/2019    Nursing Staff Provider Office Location  [place] Dating   LMP Language  English Anatomy US   ordered Flu Vaccine   declined 04-09-21 Genetic Screen  NIPS: low risk   AFP:   Screen negative  TDaP Vaccine   02/04/21 Hgb A1C or  GTT Early  Third trimester Normal   Glucose, Fasting 65 - 91 mg/dL 89  Glucose, 1 hour 65 - 179 mg/dL 174  Glucose, 2 hour 65 - 152 mg/dL 146  COVID Vaccine no   LAB RESULTS  Rhog   Past Surgical History:  Procedure Laterality Date   NO PAST SURGERIES     Family History  Problem Relation Age of Onset   Diabetes  Paternal Grandmother    Cancer Paternal Grandmother    Cancer Sister    Social History   Tobacco Use   Smoking status: Never   Smokeless tobacco: Never  Vaping Use   Vaping Use: Never used  Substance Use Topics   Alcohol use: No   Drug use: No   No Known Allergies Current Outpatient Medications on File Prior to Visit  Medication Sig Dispense Refill   Blood Pressure Monitoring (BLOOD PRESSURE KIT) DEVI 1 Device by Does not apply route as needed. ICD 10 Z34.90 1 Device 0   prenatal vitamin w/FE, FA (NATACHEW) 29-1 MG CHEW chewable tablet Chew 1 tablet by mouth daily at 12 noon. 30 tablet 11   acetaminophen (TYLENOL) 500 MG tablet Take 2 tablets (1,000 mg total) by mouth every 8 (eight) hours as needed (pain). (Patient not taking: Reported on 10/20/2022) 60 tablet 0   Prenatal Vit-Fe Fumarate-FA (PRENATAL VITAMIN) 27-0.8 MG TABS Take 1 tablet by mouth daily. (Patient not taking:  Reported on 04/22/2021) 30 tablet 11   No current facility-administered medications on file prior to visit.     Exam   Vitals:   10/20/22 1525  BP: 128/76  Pulse: 85  Weight: 143 lb 12.8 oz (65.2 kg)   Fetal Heart Rate (bpm): 161  Uterus:     Pelvic Exam: Perineum: no hemorrhoids, normal perineum   Vulva: normal external genitalia, no lesions   Vagina:  normal mucosa, normal discharge   Cervix: no lesions and normal, pap smear done.    Adnexa: normal adnexa and no mass, fullness, tenderness   Bony Pelvis: average  System: General: well-developed, well-nourished female in no acute distress   Breast:  normal appearance, no masses or tenderness   Skin: normal coloration and turgor, no rashes   Neurologic: oriented, normal, negative, normal mood   Extremities: normal strength, tone, and muscle mass, ROM of all joints is normal   HEENT PERRLA, extraocular movement intact and sclera clear, anicteric   Mouth/Teeth mucous membranes moist, pharynx normal without lesions and dental hygiene good   Neck  supple and no masses   Cardiovascular: regular rate and rhythm   Respiratory:  no respiratory distress, normal breath sounds   Abdomen: soft, non-tender; bowel sounds normal; no masses,  no organomegaly     Assessment:   Pregnancy: [id number] Patient Active Problem List   Diagnosis Date Noted   Hx of preeclampsia, prior pregnancy, currently pregnant 10/20/2022   Supervision of other normal pregnancy, antepartum 09/22/2022   History of gestational hypertension 05/19/2021   Preeclampsia in postpartum period 04/23/2021   Short interval between pregnancies affecting pregnancy, antepartum 09/24/2020   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 Supervision of other normal pregnancy, antepartum Continue routine prenatal care - Cytology - PAP( [place]) - Protein / creatinine ratio, urine - Comp Met (CMET) - HgB A1c  2. Hx of preeclampsia, prior pregnancy, currently pregnant Patient with history of preeclampsia.  Will get baseline labs.  BP within normal limits. - Protein / creatinine ratio, urine - Comp Met (CMET)  3. Vaginal discharge Wet prep collected today. - Cervicovaginal ancillary only( Brooke Patel)   Initial labs drawn. Continue prenatal vitamins. Genetic Screening discussed, previously completed  Ultrasound discussed; fetal anatomic survey:  scheduled  . Problem list reviewed and updated. The nature of [place] with multiple MDs and other Advanced Practice Providers was explained to patient; also emphasized that residents, students are part of our team. Routine obstetric precautions reviewed. No follow-ups on file.   \

## 2022-10-21 LAB — CERVICOVAGINAL ANCILLARY ONLY
Bacterial Vaginitis (gardnerella): NEGATIVE
Candida Glabrata: NEGATIVE
Candida Vaginitis: NEGATIVE
Chlamydia: NEGATIVE
Comment: NEGATIVE
Comment: NEGATIVE
Comment: NEGATIVE
Comment: NEGATIVE
Comment: NEGATIVE
Comment: NORMAL
Neisseria Gonorrhea: NEGATIVE
Trichomonas: NEGATIVE

## 2022-10-21 LAB — COMPREHENSIVE METABOLIC PANEL
ALT: 9 IU/L (ref 0–32)
AST: 13 IU/L (ref 0–40)
Albumin/Globulin Ratio: 1.4 (ref 1.2–2.2)
Albumin: 4.2 g/dL (ref 4.0–5.0)
Alkaline Phosphatase: 46 IU/L (ref 44–121)
BUN/Creatinine Ratio: 13 (ref 9–23)
BUN: 7 mg/dL (ref 6–20)
Bilirubin Total: <0.2 mg/dL (ref 0.0–1.2)
CO2: 22 mmol/L (ref 20–29)
Calcium: 9.3 mg/dL (ref 8.7–10.2)
Chloride: 102 mmol/L (ref 96–106)
Creatinine, Ser: 0.55 mg/dL — ABNORMAL LOW (ref 0.57–1.00)
Globulin, Total: 2.9 g/dL (ref 1.5–4.5)
Glucose: 91 mg/dL (ref 70–99)
Potassium: 4 mmol/L (ref 3.5–5.2)
Sodium: 138 mmol/L (ref 134–144)
Total Protein: 7.1 g/dL (ref 6.0–8.5)
eGFR: 128 mL/min/{1.73_m2} (ref >59)

## 2022-10-21 LAB — PROTEIN / CREATININE RATIO, URINE
Creatinine, Urine: 184.4 mg/dL
Protein, Ur: 11.8 mg/dL
Protein/Creat Ratio: 64 mg/g creat (ref 0–200)

## 2022-10-21 LAB — HEMOGLOBIN A1C
Est. average glucose Bld gHb Est-mCnc: 103 mg/dL
Hgb A1c MFr Bld: 5.2 % (ref 4.8–5.6)

## 2022-10-25 LAB — CYTOLOGY - PAP
Diagnosis: NEGATIVE
Diagnosis: REACTIVE

## 2022-10-25 NOTE — BH Specialist Note (Signed)
Integrated Behavioral Health Initial In-Person Visit  MRN: [id number] Name: Deniz Cerullo  Number of [place] Clinician visits: 1 Session Start time:   3:30pm Session End time: 3:45pm Total time in minutes: 15 mins in person at [facility]   Types of Service: [place] (BHI)  Interpretor:No. Interpretor Name and Language: none   Warm Hand Off Completed.        Subjective: Lashaun Muscarello is a 27 y.o. female accompanied by  oldest daughter Patient was referred by Dr Caron Presume for new ob intro. Patient reports the following symptoms/concerns: no reported concerns  Duration of problem: n/a; Severity of problem: n/a  Objective: Mood: good  and Affect: Appropriate Risk of harm to self or others: No plan to harm self or others  Life Context: Family and Social: Lives in [place]  School/Work: n/a Self-Care: rest, family time  Life Changes: new pregnancy  Patient and/or Family's Strengths/Protective Factors: Concrete supports in place (healthy food, safe environments, etc.)  Goals Addressed: Patient will: Keep prenatal appts  Take prenatal vitamins   Avoid stress, prioritize rest   Progress towards Goals: Ongoing  Interventions: Interventions utilized: Motivational Interviewing  Standardized Assessments completed: phq9  Ms. Francisco completed new ob intro Lynnea Ferrier, LCSW

## 2022-11-17 ENCOUNTER — Ambulatory Visit (INDEPENDENT_AMBULATORY_CARE_PROVIDER_SITE_OTHER): Payer: Medicaid Other | Admitting: Obstetrics and Gynecology

## 2022-11-17 VITALS — BP 117/74 | HR 87 | Wt 150.0 lb

## 2022-11-17 DIAGNOSIS — O09299 Supervision of pregnancy with other poor reproductive or obstetric history, unspecified trimester: Secondary | ICD-10-CM

## 2022-11-17 DIAGNOSIS — Z3A17 17 weeks gestation of pregnancy: Secondary | ICD-10-CM

## 2022-11-17 DIAGNOSIS — L299 Pruritus, unspecified: Secondary | ICD-10-CM

## 2022-11-17 DIAGNOSIS — Z3480 Encounter for supervision of other normal pregnancy, unspecified trimester: Secondary | ICD-10-CM

## 2022-11-17 DIAGNOSIS — O09899 Supervision of other high risk pregnancies, unspecified trimester: Secondary | ICD-10-CM

## 2022-11-17 DIAGNOSIS — O99712 Diseases of the skin and subcutaneous tissue complicating pregnancy, second trimester: Secondary | ICD-10-CM

## 2022-11-17 MED ORDER — ASPIRIN 81 MG PO CHEW: 81.0000 mg | CHEWABLE_TABLET | Freq: Every day | ORAL | 6 refills | Status: DC

## 2022-11-17 NOTE — Progress Notes (Signed)
   PRENATAL VISIT NOTE  Subjective:  Brooke Patel is a 27 y.o. G5P4004 at [redacted]w[redacted]d being seen today for ongoing prenatal care.  She is currently monitored for the following issues for this low-risk pregnancy and has Anemia; Moderate major depression; GAD (generalized anxiety disorder); Carrier of genetic disorder; Short interval between pregnancies affecting pregnancy, antepartum; Preeclampsia in postpartum period; History of gestational hypertension; Supervision of other normal pregnancy, antepartum; and Hx of preeclampsia, prior pregnancy, currently pregnant on their problem list.  Patient doing well with no acute concerns today. She reports  occasional night time itching of her hands .  Contractions: Not present. Vag. Bleeding: None.  Movement: Present. Denies leaking of fluid.   The following portions of the patient's history were reviewed and updated as appropriate: allergies, current medications, past family history, past medical history, past social history, past surgical history and problem list. Problem list updated.  Objective:   Vitals:   11/17/22 1533  BP: 117/74  Pulse: 87  Weight: 150 lb (68 kg)    Fetal Status: Fetal Heart Rate (bpm): 150 Fundal Height: 17 cm Movement: Present     General:  Alert, oriented and cooperative. Patient is in no acute distress.  Skin: Skin is warm and dry. No rash noted.   Cardiovascular: Normal heart rate noted  Respiratory: Normal respiratory effort, no problems with respiration noted  Abdomen: Soft, gravid, appropriate for gestational age.  Pain/Pressure: Absent     Pelvic: Cervical exam deferred        Extremities: Normal range of motion.     Mental Status:  Normal mood and affect. Normal behavior. Normal judgment and thought content.   Assessment and Plan:  Pregnancy: G5P4004 at [redacted]w[redacted]d  1. [redacted] weeks gestation of pregnancy   2. Supervision of other normal pregnancy, antepartum Continue routine prenatal care - AFP, Serum, Open Spina  Bifida  3. Short interval between pregnancies affecting pregnancy, antepartum   4. Hx of preeclampsia, prior pregnancy, currently pregnant Pt notes compliance with baby ASA - aspirin 81 MG chewable tablet; Chew 1 tablet (81 mg total) by mouth daily.  Dispense: 30 tablet; Refill: 6  5. Pruritus of pregnancy in second trimester Do not believe this is cholestasis but will still check labs - Comprehensive metabolic panel - Bile acids, total  Preterm labor symptoms and general obstetric precautions including but not limited to vaginal bleeding, contractions, leaking of fluid and fetal movement were reviewed in detail with the patient.  Please refer to After Visit Summary for other counseling recommendations.   Return in about 4 weeks (around 12/15/2022) for ROB, in person.   Mariel Aloe, MD Faculty Attending Center for [facility]

## 2022-11-19 LAB — AFP, SERUM, OPEN SPINA BIFIDA
AFP MoM: 1.01
AFP Value: 40.1 ng/mL
Gest. Age on Collection Date: 17.2 weeks
Maternal Age At EDD: 29.3 yr
OSBR Risk 1 IN: 10000
Test Results:: NEGATIVE
Weight: 150 [lb_av]

## 2022-11-25 ENCOUNTER — Encounter: Payer: Self-pay | Admitting: *Deleted

## 2022-11-29 ENCOUNTER — Ambulatory Visit: Payer: Medicaid Other | Admitting: *Deleted

## 2022-11-29 ENCOUNTER — Encounter: Payer: Self-pay | Admitting: *Deleted

## 2022-11-29 ENCOUNTER — Ambulatory Visit: Payer: Medicaid Other | Attending: Obstetrics & Gynecology

## 2022-11-29 ENCOUNTER — Other Ambulatory Visit: Payer: Self-pay | Admitting: *Deleted

## 2022-11-29 VITALS — BP 120/69 | HR 80

## 2022-11-29 DIAGNOSIS — Z3480 Encounter for supervision of other normal pregnancy, unspecified trimester: Secondary | ICD-10-CM | POA: Diagnosis not present

## 2022-11-29 DIAGNOSIS — Z363 Encounter for antenatal screening for malformations: Secondary | ICD-10-CM | POA: Diagnosis not present

## 2022-11-29 DIAGNOSIS — Z3A19 19 weeks gestation of pregnancy: Secondary | ICD-10-CM | POA: Insufficient documentation

## 2022-11-29 DIAGNOSIS — O09292 Supervision of pregnancy with other poor reproductive or obstetric history, second trimester: Secondary | ICD-10-CM | POA: Insufficient documentation

## 2022-11-29 DIAGNOSIS — O09299 Supervision of pregnancy with other poor reproductive or obstetric history, unspecified trimester: Secondary | ICD-10-CM

## 2022-11-29 DIAGNOSIS — Z148 Genetic carrier of other disease: Secondary | ICD-10-CM | POA: Diagnosis not present

## 2022-12-15 ENCOUNTER — Encounter: Payer: Self-pay | Admitting: Obstetrics and Gynecology

## 2022-12-15 ENCOUNTER — Ambulatory Visit (INDEPENDENT_AMBULATORY_CARE_PROVIDER_SITE_OTHER): Payer: Medicaid Other | Admitting: Obstetrics and Gynecology

## 2022-12-15 VITALS — BP 114/69 | HR 92 | Wt 148.4 lb

## 2022-12-15 DIAGNOSIS — L299 Pruritus, unspecified: Secondary | ICD-10-CM

## 2022-12-15 DIAGNOSIS — O99712 Diseases of the skin and subcutaneous tissue complicating pregnancy, second trimester: Secondary | ICD-10-CM

## 2022-12-15 DIAGNOSIS — Z3A21 21 weeks gestation of pregnancy: Secondary | ICD-10-CM

## 2022-12-15 DIAGNOSIS — Z3480 Encounter for supervision of other normal pregnancy, unspecified trimester: Secondary | ICD-10-CM

## 2022-12-15 NOTE — Progress Notes (Unsigned)
Pt presents for ROB visit. Pt c/o dizziness.

## 2022-12-16 NOTE — Progress Notes (Signed)
   LOW-RISK PREGNANCY OFFICE VISIT Patient name: Brooke Patel MRN [id number]  Date of birth: 1995-11-30 Chief Complaint:   Routine Prenatal Visit  History of Present Illness:   Brooke Patel is a 27 y.o. G2X5284 female at [redacted]w[redacted]d with an Estimated Date of Delivery: 04/25/23 being seen today for ongoing management of a low-risk pregnancy.  Today she reports  dizziness first thing in the morning and midday . She drinks ~3 bottles of water daily. Contractions: Not present. Vag. Bleeding: None.  Movement: Present. denies leaking of fluid. Review of Systems:   Pertinent items are noted in HPI Denies abnormal vaginal discharge w/ itching/odor/irritation, headaches, visual changes, shortness of breath, chest pain, abdominal pain, severe nausea/vomiting, or problems with urination or bowel movements unless otherwise stated above. Pertinent History Reviewed:  Reviewed past medical,surgical, social, obstetrical and family history.  Reviewed problem list, medications and allergies. Physical Assessment:   Vitals:   12/15/22 1539  BP: 114/69  Pulse: 92  Weight: 148 lb 6.4 oz (67.3 kg)  Body mass index is 26.29 kg/m.        Physical Examination:   General appearance: Well appearing, and in no distress  Mental status: Alert, oriented to person, place, and time  Skin: Warm & dry  Cardiovascular: Normal heart rate noted  Respiratory: Normal respiratory effort, no distress  Abdomen: Soft, gravid, nontender  Pelvic: Cervical exam deferred         Extremities: Edema: None  Fetal Status: Fetal Heart Rate (bpm): 144 Fundal Height: 23 cm Movement: Present    No results found for this or any previous visit (from the past 24 hour(s)).  Assessment & Plan:  1) Low-risk pregnancy G5P4004 at [redacted]w[redacted]d with an Estimated Date of Delivery: 04/25/23   2) Supervision of other normal pregnancy, antepartum - Discussed dizziness can be a direct result of dehydration - Advised to increase water intake to at least 8  bottles of water daily  3)  Pruritus of pregnancy in second trimester - Patient never returned to office to have Bile Acids drawn  - Patient reports itching has resolved. She thinks it was allergies or contact dermatitis. - Patient declines having Bile Acids drawn today.  4) [redacted] weeks gestation of pregnancy     Meds: No orders of the defined types were placed in this encounter.  Labs/procedures today: none  Plan:  Continue routine obstetrical care   Reviewed: Preterm labor symptoms and general obstetric precautions including but not limited to vaginal bleeding, contractions, leaking of fluid and fetal movement were reviewed in detail with the patient.  All questions were answered. Has home bp cuff. Check bp weekly, let us know if >140/90.   Follow-up: Return in about 4 weeks (around 01/12/2023) for Return OB visit.  No orders of the defined types were placed in this encounter.  Raelyn Mora MSN, CNM 12/15/2022 4:00 PM

## 2022-12-31 ENCOUNTER — Encounter: Payer: Self-pay | Admitting: *Deleted

## 2023-01-04 ENCOUNTER — Ambulatory Visit: Payer: Medicaid Other | Attending: Maternal & Fetal Medicine

## 2023-01-04 ENCOUNTER — Other Ambulatory Visit: Payer: Self-pay | Admitting: *Deleted

## 2023-01-04 DIAGNOSIS — Q6119 Other polycystic kidney, infantile type: Secondary | ICD-10-CM

## 2023-01-04 DIAGNOSIS — O285 Abnormal chromosomal and genetic finding on antenatal screening of mother: Secondary | ICD-10-CM | POA: Diagnosis not present

## 2023-01-04 DIAGNOSIS — O09299 Supervision of pregnancy with other poor reproductive or obstetric history, unspecified trimester: Secondary | ICD-10-CM

## 2023-01-04 DIAGNOSIS — Z148 Genetic carrier of other disease: Secondary | ICD-10-CM | POA: Insufficient documentation

## 2023-01-04 DIAGNOSIS — Z3A24 24 weeks gestation of pregnancy: Secondary | ICD-10-CM | POA: Diagnosis not present

## 2023-01-12 ENCOUNTER — Ambulatory Visit (INDEPENDENT_AMBULATORY_CARE_PROVIDER_SITE_OTHER): Payer: Medicaid Other | Admitting: Student

## 2023-01-12 ENCOUNTER — Encounter: Payer: Self-pay | Admitting: Student

## 2023-01-12 VITALS — BP 114/66 | HR 86 | Wt 152.6 lb

## 2023-01-12 DIAGNOSIS — O09299 Supervision of pregnancy with other poor reproductive or obstetric history, unspecified trimester: Secondary | ICD-10-CM

## 2023-01-12 DIAGNOSIS — Z3A25 25 weeks gestation of pregnancy: Secondary | ICD-10-CM

## 2023-01-12 DIAGNOSIS — Z3480 Encounter for supervision of other normal pregnancy, unspecified trimester: Secondary | ICD-10-CM

## 2023-01-12 DIAGNOSIS — Z148 Genetic carrier of other disease: Secondary | ICD-10-CM

## 2023-01-12 NOTE — Progress Notes (Signed)
Pt presents for ROB visit. No concerns.  

## 2023-01-12 NOTE — Progress Notes (Signed)
   PRENATAL VISIT NOTE  Subjective:  Brooke Patel is a 27 y.o. G5P4004 at [redacted]w[redacted]d being seen today for ongoing prenatal care.  She is currently monitored for the following issues for this low-risk pregnancy and has Carrier of genetic disorder; History of gestational hypertension; Supervision of other normal pregnancy, antepartum; and Hx of preeclampsia, prior pregnancy, currently pregnant on their problem list.  Patient reports no complaints.  Contractions: Not present. Vag. Bleeding: None.  Movement: Present. Denies leaking of fluid.   The following portions of the patient's history were reviewed and updated as appropriate: allergies, current medications, past family history, past medical history, past social history, past surgical history and problem list.   Objective:   Vitals:   01/12/23 1551  BP: 114/66  Pulse: 86  Weight: 152 lb 9.6 oz (69.2 kg)    Fetal Status: Fetal Heart Rate (bpm): 155   Movement: Present     General:  Alert, oriented and cooperative. Patient is in no acute distress.  Skin: Skin is warm and dry. No rash noted.   Cardiovascular: Normal heart rate noted  Respiratory: Normal respiratory effort, no problems with respiration noted  Abdomen: Soft, gravid, appropriate for gestational age.  Pain/Pressure: Absent     Pelvic: Cervical exam deferred        Extremities: Normal range of motion.  Edema: Trace  Mental Status: Normal mood and affect. Normal behavior. Normal judgment and thought content.   Assessment and Plan:  Pregnancy: G5P4004 at [redacted]w[redacted]d 1. Supervision of other normal pregnancy, antepartum - frequent and vigorous fetal movement  2. Carrier of genetic disorder - follow-up growth scan scheduled   3. [redacted] weeks gestation of pregnancy - gtt/tdap at next visit  4. Hx of preeclampsia, prior pregnancy, currently pregnant - normotensive   Preterm labor symptoms and general obstetric precautions including but not limited to vaginal bleeding, contractions,  leaking of fluid and fetal movement were reviewed in detail with the patient. Please refer to After Visit Summary for other counseling recommendations.   Return in about 3 weeks (around 02/02/2023) for LOB/GTT, IN-PERSON.  Future Appointments  Date Time Provider Department Center  02/02/2023  8:30 AM [facility] LAB [facility] None  02/02/2023  8:55 AM Corlis Hove, NP [facility] None  03/15/2023  3:15 PM [facility] NURSE [facility]  03/15/2023  3:30 PM [facility] US3 [facility]    Corlis Hove, NP

## 2023-02-02 ENCOUNTER — Encounter: Payer: Self-pay | Admitting: Student

## 2023-02-02 ENCOUNTER — Ambulatory Visit (INDEPENDENT_AMBULATORY_CARE_PROVIDER_SITE_OTHER): Payer: Medicaid Other | Admitting: Student

## 2023-02-02 ENCOUNTER — Other Ambulatory Visit: Payer: Medicaid Other

## 2023-02-02 VITALS — BP 121/75 | HR 83 | Wt 156.2 lb

## 2023-02-02 DIAGNOSIS — O09299 Supervision of pregnancy with other poor reproductive or obstetric history, unspecified trimester: Secondary | ICD-10-CM

## 2023-02-02 DIAGNOSIS — O09893 Supervision of other high risk pregnancies, third trimester: Secondary | ICD-10-CM

## 2023-02-02 DIAGNOSIS — Z148 Genetic carrier of other disease: Secondary | ICD-10-CM

## 2023-02-02 DIAGNOSIS — Z3480 Encounter for supervision of other normal pregnancy, unspecified trimester: Secondary | ICD-10-CM

## 2023-02-02 DIAGNOSIS — Z3A28 28 weeks gestation of pregnancy: Secondary | ICD-10-CM

## 2023-02-02 DIAGNOSIS — O09293 Supervision of pregnancy with other poor reproductive or obstetric history, third trimester: Secondary | ICD-10-CM

## 2023-02-02 DIAGNOSIS — O09899 Supervision of other high risk pregnancies, unspecified trimester: Secondary | ICD-10-CM

## 2023-02-02 NOTE — Progress Notes (Signed)
   PRENATAL VISIT NOTE  Subjective:  Brooke Patel is a 27 y.o. G5P4004 at [redacted]w[redacted]d being seen today for ongoing prenatal care.  She is currently monitored for the following issues for this low-risk pregnancy and has Carrier of genetic disorder; History of gestational hypertension; Supervision of other normal pregnancy, antepartum; and Hx of preeclampsia, prior pregnancy, currently pregnant on their problem list.  Patient reports no complaints.  Contractions: Not present. Vag. Bleeding: None.  Movement: Present. Denies leaking of fluid.   The following portions of the patient's history were reviewed and updated as appropriate: allergies, current medications, past family history, past medical history, past social history, past surgical history and problem list.   Objective:   Vitals:   02/02/23 0846  BP: 121/75  Pulse: 83  Weight: 156 lb 3.2 oz (70.9 kg)    Fetal Status: Fetal Heart Rate (bpm): 154   Movement: Present     General:  Alert, oriented and cooperative. Patient is in no acute distress.  Skin: Skin is warm and dry. No rash noted.   Cardiovascular: Normal heart rate noted  Respiratory: Normal respiratory effort, no problems with respiration noted  Abdomen: Soft, gravid, appropriate for gestational age.  Pain/Pressure: Absent     Pelvic: Cervical exam deferred        Extremities: Normal range of motion.  Edema: None  Mental Status: Normal mood and affect. Normal behavior. Normal judgment and thought content.   Assessment and Plan:  Pregnancy: G5P4004 at [redacted]w[redacted]d 1. Supervision of other normal pregnancy, antepartum - vigorous and frequent fetal movement   2. [redacted] weeks gestation of pregnancy - Third trimester labs today - tdap declined   3. 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 Hx of preeclampsia, prior pregnancy, currently pregnant - normotensive today - negative S/S of Pre-E  5. Short  interval between pregnancies affecting pregnancy, antepartum - stable   Preterm labor symptoms and general obstetric precautions including but not limited to vaginal bleeding, contractions, leaking of fluid and fetal movement were reviewed in detail with the patient. Please refer to After Visit Summary for other counseling recommendations.   Return in about 2 weeks (around 02/16/2023) for LOB, IN-PERSON.  Future Appointments  Date Time Provider Department Center  03/15/2023  3:15 PM [facility] NURSE [facility]  03/15/2023  3:30 PM [facility] US3 [facility]    Corlis Hove, NP

## 2023-02-02 NOTE — Progress Notes (Signed)
Pt presents for ROB visit. Declines Tdap.  

## 2023-02-03 LAB — RPR: RPR Ser Ql: NONREACTIVE

## 2023-02-03 LAB — HIV ANTIBODY (ROUTINE TESTING W REFLEX): HIV Screen 4th Generation wRfx: NONREACTIVE

## 2023-02-03 LAB — GLUCOSE TOLERANCE, 2 HOURS W/ 1HR
Glucose, 1 hour: 186 mg/dL — ABNORMAL HIGH (ref 70–179)
Glucose, 2 hour: 142 mg/dL (ref 70–152)
Glucose, Fasting: 87 mg/dL (ref 70–91)

## 2023-02-03 LAB — CBC
Hematocrit: 29.8 % — ABNORMAL LOW (ref 34.0–46.6)
Hemoglobin: 9.6 g/dL — ABNORMAL LOW (ref 11.1–15.9)
MCH: 27.4 pg (ref 26.6–33.0)
MCHC: 32.2 g/dL (ref 31.5–35.7)
MCV: 85 fL (ref 79–97)
Platelets: 214 10*3/uL (ref 150–450)
RBC: 3.5 x10E6/uL — ABNORMAL LOW (ref 3.77–5.28)
RDW: 13.4 % (ref 11.7–15.4)
WBC: 9.4 10*3/uL (ref 3.4–10.8)

## 2023-02-14 ENCOUNTER — Other Ambulatory Visit: Payer: Self-pay | Admitting: Student

## 2023-02-14 ENCOUNTER — Other Ambulatory Visit: Payer: Self-pay

## 2023-02-14 DIAGNOSIS — O99013 Anemia complicating pregnancy, third trimester: Secondary | ICD-10-CM

## 2023-02-14 DIAGNOSIS — O99019 Anemia complicating pregnancy, unspecified trimester: Secondary | ICD-10-CM | POA: Insufficient documentation

## 2023-02-14 DIAGNOSIS — O24419 Gestational diabetes mellitus in pregnancy, unspecified control: Secondary | ICD-10-CM

## 2023-02-14 MED ORDER — ACCU-CHEK GUIDE W/DEVICE KIT: 1.0000 | PACK | Freq: Four times a day (QID) | 0 refills | Status: DC

## 2023-02-14 MED ORDER — ACCRUFER 30 MG PO CAPS: 1.0000 | ORAL_CAPSULE | Freq: Two times a day (BID) | ORAL | 2 refills | Status: DC

## 2023-02-14 MED ORDER — ACCU-CHEK SOFTCLIX LANCETS MISC
12 refills | Status: DC
Start: 2023-02-14 — End: 2023-04-21

## 2023-02-14 MED ORDER — ACCU-CHEK GUIDE VI STRP: ORAL_STRIP | 12 refills | Status: DC

## 2023-02-14 NOTE — Progress Notes (Signed)
Referral to diabetes placed and testing supplies ordered.

## 2023-02-16 ENCOUNTER — Encounter: Payer: Self-pay | Admitting: Obstetrics and Gynecology

## 2023-02-16 ENCOUNTER — Ambulatory Visit (INDEPENDENT_AMBULATORY_CARE_PROVIDER_SITE_OTHER): Payer: Medicaid Other | Admitting: Obstetrics and Gynecology

## 2023-02-16 VITALS — BP 110/59 | HR 83 | Wt 157.0 lb

## 2023-02-16 DIAGNOSIS — Z3A30 30 weeks gestation of pregnancy: Secondary | ICD-10-CM

## 2023-02-16 DIAGNOSIS — O09299 Supervision of pregnancy with other poor reproductive or obstetric history, unspecified trimester: Secondary | ICD-10-CM

## 2023-02-16 DIAGNOSIS — Z148 Genetic carrier of other disease: Secondary | ICD-10-CM

## 2023-02-16 DIAGNOSIS — O09899 Supervision of other high risk pregnancies, unspecified trimester: Secondary | ICD-10-CM

## 2023-02-16 DIAGNOSIS — Z3480 Encounter for supervision of other normal pregnancy, unspecified trimester: Secondary | ICD-10-CM

## 2023-02-16 DIAGNOSIS — O24419 Gestational diabetes mellitus in pregnancy, unspecified control: Secondary | ICD-10-CM

## 2023-02-16 NOTE — Progress Notes (Signed)
   PRENATAL VISIT NOTE  Subjective:  Brooke Patel is a 27 y.o. G5P4004 at [redacted]w[redacted]d being seen today for ongoing prenatal care.  She is currently monitored for the following issues for this high-risk pregnancy and has Carrier of genetic disorder; History of gestational hypertension; Supervision of other normal pregnancy, antepartum; Hx of preeclampsia, prior pregnancy, currently pregnant; and Anemia in pregnancy on their problem list.  Patient reports no complaints.  Contractions: Not present. Vag. Bleeding: None.  Movement: Present. Denies leaking of fluid.   The following portions of the patient's history were reviewed and updated as appropriate: allergies, current medications, past family history, past medical history, past social history, past surgical history and problem list.   Objective:   Vitals:   02/16/23 1448  BP: (!) 110/59  Pulse: 83  Weight: 157 lb (71.2 kg)    Fetal Status: Fetal Heart Rate (bpm): 147   Movement: Present     General:  Alert, oriented and cooperative. Patient is in no acute distress.  Skin: Skin is warm and dry. No rash noted.   Cardiovascular: Normal heart rate noted  Respiratory: Normal respiratory effort, no problems with respiration noted  Abdomen: Soft, gravid, appropriate for gestational age.  Pain/Pressure: Absent     Pelvic: Cervical exam deferred        Extremities: Normal range of motion.  Edema: None  Mental Status: Normal mood and affect. Normal behavior. Normal judgment and thought content.   Assessment and Plan:  Pregnancy: G5P4004 at [redacted]w[redacted]d 1. Supervision of other normal pregnancy, antepartum BP and FHR normal  Feeling regular fetal movement  2. Gestational diabetes mellitus (GDM) in third trimester, gestational diabetes method of control unspecified Supplies provided today and discussed checking sugars. Diabetic educator referral placed today  6/11 EFW 72%, AFI normal  Follow u p8/20  3. Hx of preeclampsia, prior pregnancy,  currently pregnant Normotensive today  Continue ASA  4. Short interval between pregnancies affecting pregnancy, antepartum   5. 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 [redacted] weeks gestation of pregnancy Continue routine prenatal  Preterm labor symptoms and general obstetric precautions including but not limited to vaginal bleeding, contractions, leaking of fluid and fetal movement were reviewed in detail with the patient. Please refer to After Visit Summary for other counseling recommendations.   No follow-ups on file.  Future Appointments  Date Time Provider Department Center  03/02/2023  2:30 PM Sue Lush, FNP [facility] None  03/15/2023  3:15 PM [facility] NURSE [facility]  03/15/2023  3:30 PM [facility] US3 [facility]  03/16/2023  2:30 PM Sue Lush, FNP [facility] None    Albertine Grates, FNP

## 2023-02-18 DIAGNOSIS — O24419 Gestational diabetes mellitus in pregnancy, unspecified control: Secondary | ICD-10-CM | POA: Insufficient documentation

## 2023-03-02 ENCOUNTER — Encounter: Payer: Medicaid Other | Admitting: Obstetrics and Gynecology

## 2023-03-15 ENCOUNTER — Encounter: Payer: Self-pay | Admitting: *Deleted

## 2023-03-15 ENCOUNTER — Ambulatory Visit: Payer: Medicaid Other | Attending: Obstetrics

## 2023-03-15 ENCOUNTER — Ambulatory Visit: Payer: Medicaid Other | Admitting: *Deleted

## 2023-03-15 VITALS — BP 121/69 | HR 101

## 2023-03-15 DIAGNOSIS — O09293 Supervision of pregnancy with other poor reproductive or obstetric history, third trimester: Secondary | ICD-10-CM | POA: Insufficient documentation

## 2023-03-15 DIAGNOSIS — Z3A34 34 weeks gestation of pregnancy: Secondary | ICD-10-CM | POA: Insufficient documentation

## 2023-03-15 DIAGNOSIS — Z148 Genetic carrier of other disease: Secondary | ICD-10-CM | POA: Diagnosis not present

## 2023-03-15 DIAGNOSIS — O99019 Anemia complicating pregnancy, unspecified trimester: Secondary | ICD-10-CM | POA: Insufficient documentation

## 2023-03-15 DIAGNOSIS — Q6119 Other polycystic kidney, infantile type: Secondary | ICD-10-CM | POA: Insufficient documentation

## 2023-03-15 DIAGNOSIS — O285 Abnormal chromosomal and genetic finding on antenatal screening of mother: Secondary | ICD-10-CM

## 2023-03-15 DIAGNOSIS — Z3480 Encounter for supervision of other normal pregnancy, unspecified trimester: Secondary | ICD-10-CM

## 2023-03-15 DIAGNOSIS — O09299 Supervision of pregnancy with other poor reproductive or obstetric history, unspecified trimester: Secondary | ICD-10-CM | POA: Insufficient documentation

## 2023-03-16 ENCOUNTER — Ambulatory Visit: Payer: Medicaid Other | Admitting: Obstetrics and Gynecology

## 2023-03-16 VITALS — BP 128/74 | HR 88 | Wt 159.2 lb

## 2023-03-16 DIAGNOSIS — O99013 Anemia complicating pregnancy, third trimester: Secondary | ICD-10-CM | POA: Diagnosis not present

## 2023-03-16 DIAGNOSIS — Z3A34 34 weeks gestation of pregnancy: Secondary | ICD-10-CM

## 2023-03-16 DIAGNOSIS — O09893 Supervision of other high risk pregnancies, third trimester: Secondary | ICD-10-CM | POA: Diagnosis not present

## 2023-03-16 DIAGNOSIS — Z3480 Encounter for supervision of other normal pregnancy, unspecified trimester: Secondary | ICD-10-CM

## 2023-03-16 DIAGNOSIS — O24419 Gestational diabetes mellitus in pregnancy, unspecified control: Secondary | ICD-10-CM

## 2023-03-16 DIAGNOSIS — O09299 Supervision of pregnancy with other poor reproductive or obstetric history, unspecified trimester: Secondary | ICD-10-CM

## 2023-03-16 DIAGNOSIS — O09293 Supervision of pregnancy with other poor reproductive or obstetric history, third trimester: Secondary | ICD-10-CM | POA: Diagnosis not present

## 2023-03-16 DIAGNOSIS — O09899 Supervision of other high risk pregnancies, unspecified trimester: Secondary | ICD-10-CM

## 2023-03-16 NOTE — Progress Notes (Signed)
   PRENATAL VISIT NOTE  Subjective:  Brooke Patel is a 27 y.o. G5P4004 at [redacted]w[redacted]d being seen today for ongoing prenatal care.  She is currently monitored for the following issues for this high-risk pregnancy and has Carrier of genetic disorder; History of gestational hypertension; Supervision of other normal pregnancy, antepartum; Hx of preeclampsia, prior pregnancy, currently pregnant; Anemia in pregnancy; and Gestational diabetes mellitus (GDM) in third trimester on their problem list.  Patient reports no complaints.  Contractions: Irritability. Vag. Bleeding: None.  Movement: Present. Denies leaking of fluid.   The following portions of the patient's history were reviewed and updated as appropriate: allergies, current medications, past family history, past medical history, past social history, past surgical history and problem list.   Objective:   Vitals:   03/16/23 1448  BP: 128/74  Pulse: 88  Weight: 159 lb 3.2 oz (72.2 kg)    Fetal Status: Fetal Heart Rate (bpm): 147 Fundal Height: 34 cm Movement: Present     General:  Alert, oriented and cooperative. Patient is in no acute distress.  Skin: Skin is warm and dry. No rash noted.   Cardiovascular: Normal heart rate noted  Respiratory: Normal respiratory effort, no problems with respiration noted  Abdomen: Soft, gravid, appropriate for gestational age.  Pain/Pressure: Absent     Pelvic: Cervical exam deferred        Extremities: Normal range of motion.  Edema: None  Mental Status: Normal mood and affect. Normal behavior. Normal judgment and thought content.   Assessment and Plan:  Pregnancy: G5P4004 at [redacted]w[redacted]d 1. Supervision of other normal pregnancy, antepartum BP and FHR normal FH appropriate Feeling regular fetal movement  2. Gestational diabetes mellitus (GDM) in third trimester, gestational diabetes method of control unspecified Did not bring log today, reports fastings in the 90-80, pp majority under 120. Discussed  importance of bringing log to assess control or need for medication.  8/20 u/s afi nml, EFW 59%, will need repeat growth 36 weeks  3. Hx of preeclampsia, prior pregnancy, currently pregnant Normotensive today, continue ASA  4. Short interval between pregnancies affecting pregnancy, antepartum   5. Anemia during pregnancy in third trimester 7/10 hgb 9.6, on oral iron  Recheck cbc next visit  6. [redacted] weeks gestation of pregnancy Anticipatory guidance regarding swabs next visit   Preterm labor symptoms and general obstetric precautions including but not limited to vaginal bleeding, contractions, leaking of fluid and fetal movement were reviewed in detail with the patient. Please refer to After Visit Summary for other counseling recommendations.   Return in two weeks for routine prenatal   Future Appointments  Date Time Provider Department Center  04/04/2023  2:50 PM Sue Lush, FNP [facility] None    Albertine Grates, FNP

## 2023-04-04 ENCOUNTER — Ambulatory Visit (INDEPENDENT_AMBULATORY_CARE_PROVIDER_SITE_OTHER): Payer: Medicaid Other | Admitting: Obstetrics and Gynecology

## 2023-04-04 ENCOUNTER — Other Ambulatory Visit (HOSPITAL_COMMUNITY)
Admission: RE | Admit: 2023-04-04 | Discharge: 2023-04-04 | Disposition: A | Discharge status: Home / Self Care | Payer: Medicaid Other | Source: Ambulatory Visit | Attending: Obstetrics and Gynecology | Admitting: Obstetrics and Gynecology

## 2023-04-04 VITALS — BP 122/71 | HR 80 | Wt 159.0 lb

## 2023-04-04 DIAGNOSIS — O09293 Supervision of pregnancy with other poor reproductive or obstetric history, third trimester: Secondary | ICD-10-CM | POA: Diagnosis not present

## 2023-04-04 DIAGNOSIS — O99013 Anemia complicating pregnancy, third trimester: Secondary | ICD-10-CM | POA: Diagnosis not present

## 2023-04-04 DIAGNOSIS — O09899 Supervision of other high risk pregnancies, unspecified trimester: Secondary | ICD-10-CM

## 2023-04-04 DIAGNOSIS — O24419 Gestational diabetes mellitus in pregnancy, unspecified control: Secondary | ICD-10-CM | POA: Insufficient documentation

## 2023-04-04 DIAGNOSIS — O09299 Supervision of pregnancy with other poor reproductive or obstetric history, unspecified trimester: Secondary | ICD-10-CM

## 2023-04-04 DIAGNOSIS — Z3A37 37 weeks gestation of pregnancy: Secondary | ICD-10-CM

## 2023-04-04 DIAGNOSIS — O09893 Supervision of other high risk pregnancies, third trimester: Secondary | ICD-10-CM | POA: Diagnosis not present

## 2023-04-04 DIAGNOSIS — Z3480 Encounter for supervision of other normal pregnancy, unspecified trimester: Secondary | ICD-10-CM

## 2023-04-04 MED ORDER — METFORMIN HCL 500 MG PO TABS
500.0000 mg | ORAL_TABLET | Freq: Every day | ORAL | 5 refills | Status: DC
Start: 2023-04-04 — End: 2023-04-21

## 2023-04-04 NOTE — Progress Notes (Addendum)
   PRENATAL VISIT NOTE  Subjective:  Brooke Patel is a 27 y.o. G5P4004 at [redacted]w[redacted]d being seen today for ongoing prenatal care.  She is currently monitored for the following issues for this low-risk pregnancy and has Carrier of genetic disorder; History of gestational hypertension; Supervision of other normal pregnancy, antepartum; Hx of preeclampsia, prior pregnancy, currently pregnant; Anemia in pregnancy; and Gestational diabetes mellitus (GDM) in third trimester on their problem list.  Patient reports no complaints.  Contractions: Irritability. Vag. Bleeding: None.  Movement: Present. Denies leaking of fluid.   The following portions of the patient's history were reviewed and updated as appropriate: allergies, current medications, past family history, past medical history, past social history, past surgical history and problem list.   Objective:   Vitals:   04/04/23 1504  BP: 122/71  Pulse: 80  Weight: 159 lb (72.1 kg)    Fetal Status: Fetal Heart Rate (bpm): 143 Fundal Height: 37 cm Movement: Present  Presentation: Vertex  General:  Alert, oriented and cooperative. Patient is in no acute distress.  Skin: Skin is warm and dry. No rash noted.   Cardiovascular: Normal heart rate noted  Respiratory: Normal respiratory effort, no problems with respiration noted  Abdomen: Soft, gravid, appropriate for gestational age.  Pain/Pressure: Absent     Pelvic: Cervical exam performed in the presence of a chaperone Dilation: 1 Effacement (%): Thick Station: -3  Extremities: Normal range of motion.  Edema: None  Mental Status: Normal mood and affect. Normal behavior. Normal judgment and thought content.   Assessment and Plan:  Pregnancy: G5P4004 at [redacted]w[redacted]d 1. Supervision of other normal pregnancy, antepartum BP and FHR normal Feeling regular fetal movement FH appropriate  2. [redacted] weeks gestation of pregnancy Swabs today, desired SVE 1/th/-3 - Culture, beta strep (group b only) - Cervicovaginal  ancillary only( [place]) - CBC  3. Gestational diabetes mellitus (GDM) in third trimester, gestational diabetes method of control unspecified Reviewed log, fastings majority < 95, Majority pp > 120s (130s-140s). Recommend metformin AM, continue dietary changes, discussed exercise after meals.  8/20 u/s EFW 59%, AC 94%, afi nml Will order BPP  - metFORMIN (GLUCOPHAGE) 500 MG tablet; Take 1 tablet (500 mg total) by mouth daily with breakfast.  Dispense: 60 tablet; Refill: 5 - [place] MFM FETAL BPP WO NON STRESS; Future  4. Hx of preeclampsia, prior pregnancy, currently pregnant Normotensive, continue ASA  5. Short interval between pregnancies affecting pregnancy, antepartum   6. Anemia during pregnancy in third trimester 7/10 hgb 9.6, On oral iron, checking CBC today   Preterm labor symptoms and general obstetric precautions including but not limited to vaginal bleeding, contractions, leaking of fluid and fetal movement were reviewed in detail with the patient. Please refer to After Visit Summary for other counseling recommendations.   Future Appointments  Date Time Provider Department Center  04/14/2023  2:50 PM Sue Lush, FNP [facility] None  04/21/2023  2:30 PM Adam Phenix, MD [facility] None  04/28/2023  2:50 PM Milas Hock, MD [facility] None    Albertine Grates, FNP

## 2023-04-05 LAB — CBC
Hematocrit: 29.8 % — ABNORMAL LOW (ref 34.0–46.6)
Hemoglobin: 9.5 g/dL — ABNORMAL LOW (ref 11.1–15.9)
MCH: 25.7 pg — ABNORMAL LOW (ref 26.6–33.0)
MCHC: 31.9 g/dL (ref 31.5–35.7)
MCV: 81 fL (ref 79–97)
Platelets: 191 10*3/uL (ref 150–450)
RBC: 3.7 x10E6/uL — ABNORMAL LOW (ref 3.77–5.28)
RDW: 15.2 % (ref 11.7–15.4)
WBC: 7.7 10*3/uL (ref 3.4–10.8)

## 2023-04-05 LAB — CERVICOVAGINAL ANCILLARY ONLY
Bacterial Vaginitis (gardnerella): NEGATIVE
Candida Glabrata: NEGATIVE
Candida Vaginitis: NEGATIVE
Chlamydia: NEGATIVE
Comment: NEGATIVE
Comment: NEGATIVE
Comment: NEGATIVE
Comment: NEGATIVE
Comment: NEGATIVE
Comment: NORMAL
Neisseria Gonorrhea: NEGATIVE
Trichomonas: NEGATIVE

## 2023-04-06 ENCOUNTER — Encounter: Payer: Self-pay | Admitting: Obstetrics and Gynecology

## 2023-04-06 NOTE — Addendum Note (Signed)
Addended by: Sue Lush on: 04/06/2023 08:11 AM   Modules accepted: Orders

## 2023-04-07 ENCOUNTER — Telehealth: Payer: Self-pay

## 2023-04-07 NOTE — Telephone Encounter (Signed)
Auth Submission: NO AUTH NEEDED Site of care: Site of care: [facility] Payer: Medicaid Medication & CPT/J Code(s) submitted: Venofer (Iron Sucrose) J1756 Route of submission (phone, fax, portal):  Phone # Fax # Auth type: Buy/Bill PB Units/visits requested: 500mg  x 1 dose Reference number:  Approval from: 04/07/23 to 07/26/23

## 2023-04-08 ENCOUNTER — Encounter: Payer: Self-pay | Admitting: *Deleted

## 2023-04-08 LAB — CULTURE, BETA STREP (GROUP B ONLY): Strep Gp B Culture: NEGATIVE

## 2023-04-12 ENCOUNTER — Ambulatory Visit: Payer: Medicaid Other | Attending: Obstetrics and Gynecology

## 2023-04-12 ENCOUNTER — Other Ambulatory Visit: Payer: Self-pay | Admitting: Obstetrics and Gynecology

## 2023-04-12 DIAGNOSIS — O24415 Gestational diabetes mellitus in pregnancy, controlled by oral hypoglycemic drugs: Secondary | ICD-10-CM

## 2023-04-12 DIAGNOSIS — O24419 Gestational diabetes mellitus in pregnancy, unspecified control: Secondary | ICD-10-CM | POA: Insufficient documentation

## 2023-04-12 DIAGNOSIS — Z148 Genetic carrier of other disease: Secondary | ICD-10-CM | POA: Diagnosis not present

## 2023-04-12 DIAGNOSIS — O09293 Supervision of pregnancy with other poor reproductive or obstetric history, third trimester: Secondary | ICD-10-CM | POA: Diagnosis not present

## 2023-04-12 DIAGNOSIS — Z3A38 38 weeks gestation of pregnancy: Secondary | ICD-10-CM

## 2023-04-14 ENCOUNTER — Ambulatory Visit (INDEPENDENT_AMBULATORY_CARE_PROVIDER_SITE_OTHER): Payer: Medicaid Other | Admitting: Obstetrics and Gynecology

## 2023-04-14 VITALS — BP 130/75 | HR 75 | Wt 160.4 lb

## 2023-04-14 DIAGNOSIS — O99013 Anemia complicating pregnancy, third trimester: Secondary | ICD-10-CM

## 2023-04-14 DIAGNOSIS — O09299 Supervision of pregnancy with other poor reproductive or obstetric history, unspecified trimester: Secondary | ICD-10-CM

## 2023-04-14 DIAGNOSIS — O24419 Gestational diabetes mellitus in pregnancy, unspecified control: Secondary | ICD-10-CM

## 2023-04-14 DIAGNOSIS — Z3480 Encounter for supervision of other normal pregnancy, unspecified trimester: Secondary | ICD-10-CM

## 2023-04-14 DIAGNOSIS — O09899 Supervision of other high risk pregnancies, unspecified trimester: Secondary | ICD-10-CM

## 2023-04-14 NOTE — Progress Notes (Signed)
   PRENATAL VISIT NOTE  Subjective:  Brooke Patel is a 27 y.o. G5P4004 at [redacted]w[redacted]d being seen today for ongoing prenatal care.  She is currently monitored for the following issues for this high-risk pregnancy and has Carrier of genetic disorder; History of gestational hypertension; Hx of preeclampsia, prior pregnancy, currently pregnant; Anemia in pregnancy; and Gestational diabetes mellitus (GDM) in third trimester on their problem list.  Patient reports no complaints.  Contractions: Not present. Vag. Bleeding: None.  Movement: Present. Denies leaking of fluid.   The following portions of the patient's history were reviewed and updated as appropriate: allergies, current medications, past family history, past medical history, past social history, past surgical history and problem list.   Objective:   Vitals:   04/14/23 1454  BP: 130/75  Pulse: 75  Weight: 160 lb 6.4 oz (72.8 kg)    Fetal Status: Fetal Heart Rate (bpm): 153 Fundal Height: 38 cm Movement: Present     General:  Alert, oriented and cooperative. Patient is in no acute distress.  Skin: Skin is warm and dry. No rash noted.   Cardiovascular: Normal heart rate noted  Respiratory: Normal respiratory effort, no problems with respiration noted  Abdomen: Soft, gravid, appropriate for gestational age.  Pain/Pressure: Present (Cramping)     Pelvic: Cervical exam deferred        Extremities: Normal range of motion.  Edema: None  Mental Status: Normal mood and affect. Normal behavior. Normal judgment and thought content.   Assessment and Plan:  Pregnancy: G5P4004 at [redacted]w[redacted]d 1. Supervision of other normal pregnancy, antepartum BP and FHR normal Feeling regular fetal movement Doing well overall  2. Gestational diabetes mellitus (GDM) in third trimester, gestational diabetes method of control unspecified 9/17 u/s efw 43%, afi nml BPP 8/8 Majority fasting and pp normal couple outliers after meals Taking metformin 500mg  AM IOL  scheduled for 39 wks   3. Hx of preeclampsia, prior pregnancy, currently pregnant Normotensive   4. Short interval between pregnancies affecting pregnancy, antepartum   5. Anemia during pregnancy in third trimester Hgb 9.5 9/9 iv venofer ordered    Term labor symptoms and general obstetric precautions including but not limited to vaginal bleeding, contractions, leaking of fluid and fetal movement were reviewed in detail with the patient. Please refer to After Visit Summary for other counseling recommendations.     Future Appointments  Date Time Provider Department Center  04/18/2023  9:15 AM [facility] None  04/21/2023  2:30 PM Adam Phenix, MD [facility] None  04/28/2023  2:50 PM Milas Hock, MD [facility] None    Albertine Grates, FNP

## 2023-04-15 ENCOUNTER — Other Ambulatory Visit: Payer: Self-pay | Admitting: Advanced Practice Midwife

## 2023-04-18 ENCOUNTER — Inpatient Hospital Stay (HOSPITAL_COMMUNITY)
Admission: AD | Admit: 2023-04-18 | Discharge: 2023-04-18 | Disposition: A | Discharge status: Home / Self Care | Payer: Medicaid Other | Attending: Obstetrics and Gynecology | Admitting: Obstetrics and Gynecology

## 2023-04-18 ENCOUNTER — Encounter (HOSPITAL_COMMUNITY): Payer: Self-pay | Admitting: Obstetrics and Gynecology

## 2023-04-18 ENCOUNTER — Encounter (HOSPITAL_COMMUNITY): Payer: Self-pay

## 2023-04-18 ENCOUNTER — Telehealth (HOSPITAL_COMMUNITY): Payer: Self-pay | Admitting: *Deleted

## 2023-04-18 ENCOUNTER — Ambulatory Visit: Payer: Medicaid Other

## 2023-04-18 VITALS — BP 132/86 | HR 81 | Temp 98.1°F | Resp 18 | Ht 63.0 in | Wt 160.4 lb

## 2023-04-18 DIAGNOSIS — O09293 Supervision of pregnancy with other poor reproductive or obstetric history, third trimester: Secondary | ICD-10-CM | POA: Insufficient documentation

## 2023-04-18 DIAGNOSIS — O99013 Anemia complicating pregnancy, third trimester: Secondary | ICD-10-CM | POA: Diagnosis not present

## 2023-04-18 DIAGNOSIS — T7840XA Allergy, unspecified, initial encounter: Secondary | ICD-10-CM | POA: Insufficient documentation

## 2023-04-18 DIAGNOSIS — O99820 Streptococcus B carrier state complicating pregnancy: Secondary | ICD-10-CM | POA: Insufficient documentation

## 2023-04-18 DIAGNOSIS — Z3A38 38 weeks gestation of pregnancy: Secondary | ICD-10-CM

## 2023-04-18 DIAGNOSIS — Z3A39 39 weeks gestation of pregnancy: Secondary | ICD-10-CM | POA: Insufficient documentation

## 2023-04-18 DIAGNOSIS — O9A213 Injury, poisoning and certain other consequences of external causes complicating pregnancy, third trimester: Secondary | ICD-10-CM | POA: Diagnosis not present

## 2023-04-18 DIAGNOSIS — T454X5A Adverse effect of iron and its compounds, initial encounter: Secondary | ICD-10-CM

## 2023-04-18 DIAGNOSIS — X58XXXA Exposure to other specified factors, initial encounter: Secondary | ICD-10-CM | POA: Diagnosis not present

## 2023-04-18 DIAGNOSIS — D508 Other iron deficiency anemias: Secondary | ICD-10-CM

## 2023-04-18 DIAGNOSIS — O26893 Other specified pregnancy related conditions, third trimester: Secondary | ICD-10-CM | POA: Diagnosis present

## 2023-04-18 MED ORDER — ACETAMINOPHEN 325 MG PO TABS
650.0000 mg | ORAL_TABLET | Freq: Once | ORAL | Status: AC
  Administered 2023-04-18: 650 mg via ORAL
  Filled 2023-04-18: qty 2

## 2023-04-18 MED ORDER — DIPHENHYDRAMINE HCL 50 MG/ML IJ SOLN
50.0000 mg | Freq: Once | INTRAMUSCULAR | Status: AC | PRN
  Administered 2023-04-18: 50 mg via INTRAVENOUS

## 2023-04-18 MED ORDER — IRON SUCROSE 500 MG IVPB - SIMPLE MED
500.0000 mg | Freq: Once | INTRAVENOUS | Status: AC
  Administered 2023-04-18: 500 mg via INTRAVENOUS
  Filled 2023-04-18: qty 500

## 2023-04-18 MED ORDER — ALBUTEROL SULFATE HFA 108 (90 BASE) MCG/ACT IN AERS: 2.0000 | INHALATION_SPRAY | Freq: Once | RESPIRATORY_TRACT | Status: DC | PRN

## 2023-04-18 MED ORDER — FAMOTIDINE IN NACL 20-0.9 MG/50ML-% IV SOLN: 20.0000 mg | Freq: Once | INTRAVENOUS | Status: DC | PRN

## 2023-04-18 MED ORDER — EPINEPHRINE 0.3 MG/0.3ML IJ SOAJ: 0.3000 mg | Freq: Once | INTRAMUSCULAR | Status: DC | PRN

## 2023-04-18 MED ORDER — METHYLPREDNISOLONE SODIUM SUCC 125 MG IJ SOLR
125.0000 mg | Freq: Once | INTRAMUSCULAR | Status: AC | PRN
  Administered 2023-04-18: 125 mg via INTRAVENOUS

## 2023-04-18 MED ORDER — DIPHENHYDRAMINE HCL 25 MG PO CAPS
25.0000 mg | ORAL_CAPSULE | Freq: Once | ORAL | Status: AC
  Administered 2023-04-18: 25 mg via ORAL
  Filled 2023-04-18: qty 1

## 2023-04-18 MED ORDER — SODIUM CHLORIDE 0.9 % IV SOLN: Freq: Once | INTRAVENOUS | Status: AC | PRN

## 2023-04-18 NOTE — MAU Note (Signed)
.  Brooke Patel is a 27 y.o. at [redacted]w[redacted]d here in MAU reporting: came over from [facility] via EMS after having an infusion reaction to Venofer. The infusion was started at 1000 and ran over 4 hours. She was at the clinic to receive Venofer and was almost finished with the infusion when she began to have itching and hives from the thighs down. They stopped the infusion, gave her benadryl, solumedrol, and gave her a NS bolus. She reports feeling much better now and is no longer experiencing any itching or hives.  Onset of complaint: today Pain score: denies pain Vitals:   04/18/23 1603  BP: 134/82  Pulse: 89  Resp: 16  Temp: 99 F (37.2 C)  SpO2: 99%     FHT:150 Lab orders placed from triage:  N/A

## 2023-04-18 NOTE — Discharge Instructions (Signed)
You can take another 50 mg Benadryl at 8:15 PM tonight

## 2023-04-18 NOTE — MAU Provider Note (Signed)
History     CSN: [id number]  Arrival date and time: 04/18/23 1552   Event Date/Time   First Provider Initiated Contact with Patient 04/18/23 1623      Chief Complaint  Patient presents with   Infusion reaction   HPI Ms. Brooke Patel is a 27 y.o. year old G75P4004 female at 100w0d weeks gestation who presents to MAU via EMS from the Infusion Center reporting an allergic reaction to Venofer near the end of the infusion. Patient reported itching & hives from her thighs down. The infusion staff stopped the medication, administering the Venofer, gave a NS bolus, gave Benadryl 50 mg IV, Solumedrol 125 mg IV. Upon arrival to MAU, the patient reports "feeling better." She states, "I just want to go home and sleep." She receives [facility] with [facility]; next appt is IOL on 04/20/2023. Her spouse is present and contributing to the history taking.    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   Preeclampsia in postpartum period 04/23/2021   Short interval between pregnancies affecting pregnancy, antepartum 09/24/2020   Supervision of low-risk pregnancy 04/09/2019    Nursing Staff Provider Office Location  [facility] Dating   LMP Language  English Anatomy US   ordered Flu Vaccine   declined 04-09-21 Genetic Screen  NIPS: low risk   AFP:   Screen negative  TDaP Vaccine   02/04/21 Hgb A1C or  GTT Early  Third trimester Normal   Glucose, Fasting 65 - 91 mg/dL 89  Glucose, 1 hour 65 - 179 mg/dL 403  Glucose, 2 hour 65 - 152 mg/dL 474  COVID Vaccine no   LAB RESULTS  Rhog   Supervision of other normal  pregnancy, antepartum 09/22/2022              Nursing Staff    Provider      Office Location    [facility]    Dating     04/18/2023, by Last Menstrual Period      [facility] Model    [ x ] Traditional  [ ]  Centering  [ ]  Mom-Baby Dyad                Language     English    Anatomy US     19 weeks reassess anatomy at 24 weeks      Flu Vaccine      No    Genetic/Carrier Screen     NIPS:   negative  AFP:   negative  [place]:      TDaP Vaccine        Past Surgical History:  Procedure Laterality Date   NO PAST SURGERIES      Family History  Problem Relation Age of Onset   Diabetes Paternal Grandmother    Cancer Paternal Grandmother  Cancer Sister     Social History   Tobacco Use   Smoking status: Never   Smokeless tobacco: Never  Vaping Use   Vaping status: Never Used  Substance Use Topics   Alcohol use: No   Drug use: No    Allergies:  Allergies  Allergen Reactions   Venofer [Iron Sucrose] Itching and Rash    Medications Prior to Admission  Medication Sig Dispense Refill Last Dose   Ferric Maltol (ACCRUFER) 30 MG CAPS Take 1 capsule (30 mg total) by mouth 2 (two) times daily. taken 1 hour before or 2 hours after meal 60 capsule 2 04/17/2023   metFORMIN (GLUCOPHAGE) 500 MG tablet Take 1 tablet (500 mg total) by mouth daily with breakfast. 60 tablet 5 04/17/2023   prenatal vitamin w/FE, FA (NATACHEW) 29-1 MG CHEW chewable tablet Chew 1 tablet by mouth daily at 12 noon. 30 tablet 11 04/17/2023   Accu-Chek Softclix Lancets lancets Use to check blood sugars 4 times a day as instructed 100 each 12    aspirin 81 MG chewable tablet Chew 1 tablet (81 mg total) by mouth daily. 30 tablet 6    Blood Glucose Monitoring Suppl (ACCU-CHEK GUIDE) w/Device KIT 1 Device by Does not apply route 4 (four) times daily. 1 kit 0    Blood Pressure Monitoring (BLOOD PRESSURE KIT) DEVI 1 Device by Does not apply route as needed. ICD 10 Z34.90 (Patient not taking: Reported on 03/16/2023) 1 Device 0    glucose blood  (ACCU-CHEK GUIDE) test strip Use to check blood sugars four times a day was instructed 50 each 12     Review of Systems  Constitutional:  Positive for fatigue.  HENT: Negative.    Eyes: Negative.   Respiratory: Negative.    Cardiovascular: Negative.   Gastrointestinal: Negative.   Endocrine: Negative.   Genitourinary: Negative.   Musculoskeletal: Negative.   Skin: Negative.   Allergic/Immunologic: Negative.   Neurological: Negative.   Hematological: Negative.   Psychiatric/Behavioral: Negative.     Physical Exam   Blood pressure 135/80, pulse 80, temperature 99 F (37.2 C), temperature source Oral, resp. rate 16, height 5\' 3"  (1.6 m), weight 72.6 kg, last menstrual period 07/12/2022, SpO2 99%.  Physical Exam Vitals and nursing note reviewed.  Constitutional:      Appearance: Normal appearance. She is normal weight.  Neurological:     Mental Status: She is alert.    REACTIVE NST - FHR: 140 bpm / moderate variability / accels present / decels absent / TOCO: none  MAU Course  Procedures  MDM CEFM  Assessment and Plan   1. Allergic reaction to drug, initial encounter   2. [redacted] weeks gestation of pregnancy   - Discharge home - Keep IOL appt on 04/20/2023 - Patient verbalized an understanding of the plan of care and agrees.   Raelyn Mora, CNM 04/18/2023, 4:23 PM

## 2023-04-18 NOTE — Telephone Encounter (Signed)
Preadmission screen  

## 2023-04-18 NOTE — Progress Notes (Signed)
Diagnosis: Acute Anemia  Provider:  Chilton Greathouse MD  Procedure: IV Infusion  IV Type: Peripheral, IV Location: L Forearm  Venofer (Iron Sucrose), Dose: 500 mg  Infusion Start Time: 1010  Infusion Stop Time: 1415   Normal Saline, Dose: 1000 ml  Infusion Start Time: 1418  Infusion Stop Time: 1518  1414: patient c/o itching, redness & rashes on bilateral lower extremity, mostly on thigh areas. No SOB, appears pale, no other symptoms reported. Benadryl 50 mg.& Solumedrol 125 mg given IV push per protocol. Ordering provider, Albertine Grates, FNP notified. Patient continued to be monitored. After a few minutes patient c/o shaking, no chills, jittery feeling. Ordered to be sent to the emergency room for further evaluation.  Post Infusion IV Care:  EMS to ED  Discharge: Condition: Good, Destination: ED  . AVS Provided  Performed by:  Sabitri Ranabhat,RN

## 2023-04-20 ENCOUNTER — Inpatient Hospital Stay (HOSPITAL_COMMUNITY)
Admission: AD | Admit: 2023-04-20 | Discharge: 2023-04-21 | DRG: 807 | Disposition: A | Discharge status: Home / Self Care | Payer: Medicaid Other | Attending: Obstetrics and Gynecology | Admitting: Obstetrics and Gynecology

## 2023-04-20 ENCOUNTER — Encounter (HOSPITAL_COMMUNITY): Payer: Self-pay | Admitting: Obstetrics and Gynecology

## 2023-04-20 ENCOUNTER — Inpatient Hospital Stay (HOSPITAL_COMMUNITY): Payer: Medicaid Other

## 2023-04-20 ENCOUNTER — Inpatient Hospital Stay (HOSPITAL_COMMUNITY)
Admission: RE | Admit: 2023-04-20 | Payer: Medicaid Other | Source: Home / Self Care | Admitting: Obstetrics and Gynecology

## 2023-04-20 DIAGNOSIS — O26893 Other specified pregnancy related conditions, third trimester: Secondary | ICD-10-CM | POA: Diagnosis present

## 2023-04-20 DIAGNOSIS — Z3A39 39 weeks gestation of pregnancy: Secondary | ICD-10-CM

## 2023-04-20 DIAGNOSIS — Z3480 Encounter for supervision of other normal pregnancy, unspecified trimester: Secondary | ICD-10-CM

## 2023-04-20 DIAGNOSIS — Z833 Family history of diabetes mellitus: Secondary | ICD-10-CM

## 2023-04-20 DIAGNOSIS — Z148 Genetic carrier of other disease: Secondary | ICD-10-CM

## 2023-04-20 DIAGNOSIS — O24424 Gestational diabetes mellitus in childbirth, insulin controlled: Secondary | ICD-10-CM | POA: Diagnosis not present

## 2023-04-20 DIAGNOSIS — O24425 Gestational diabetes mellitus in childbirth, controlled by oral hypoglycemic drugs: Secondary | ICD-10-CM | POA: Diagnosis present

## 2023-04-20 DIAGNOSIS — O9902 Anemia complicating childbirth: Secondary | ICD-10-CM | POA: Diagnosis present

## 2023-04-20 DIAGNOSIS — O24419 Gestational diabetes mellitus in pregnancy, unspecified control: Principal | ICD-10-CM | POA: Diagnosis present

## 2023-04-20 LAB — COMPREHENSIVE METABOLIC PANEL
ALT: 13 U/L (ref 0–44)
AST: 22 U/L (ref 15–41)
Albumin: 2.8 g/dL — ABNORMAL LOW (ref 3.5–5.0)
Alkaline Phosphatase: 110 U/L (ref 38–126)
Anion gap: 11 (ref 5–15)
BUN: 14 mg/dL (ref 6–20)
CO2: 19 mmol/L — ABNORMAL LOW (ref 22–32)
Calcium: 8.7 mg/dL — ABNORMAL LOW (ref 8.9–10.3)
Chloride: 103 mmol/L (ref 98–111)
Creatinine, Ser: 0.7 mg/dL (ref 0.44–1.00)
GFR, Estimated: >60 mL/min (ref >60)
Glucose, Bld: 101 mg/dL — ABNORMAL HIGH (ref 70–99)
Potassium: 3.6 mmol/L (ref 3.5–5.1)
Sodium: 133 mmol/L — ABNORMAL LOW (ref 135–145)
Total Bilirubin: 0.4 mg/dL (ref 0.3–1.2)
Total Protein: 6.6 g/dL (ref 6.5–8.1)

## 2023-04-20 LAB — CBC
HCT: 29.5 % — ABNORMAL LOW (ref 36.0–46.0)
Hemoglobin: 9.3 g/dL — ABNORMAL LOW (ref 12.0–15.0)
MCH: 25.6 pg — ABNORMAL LOW (ref 26.0–34.0)
MCHC: 31.5 g/dL (ref 30.0–36.0)
MCV: 81.3 fL (ref 80.0–100.0)
Platelets: 171 10*3/uL (ref 150–400)
RBC: 3.63 MIL/uL — ABNORMAL LOW (ref 3.87–5.11)
RDW: 16.3 % — ABNORMAL HIGH (ref 11.5–15.5)
WBC: 11.4 10*3/uL — ABNORMAL HIGH (ref 4.0–10.5)
nRBC: 0.2 % (ref 0.0–0.2)

## 2023-04-20 LAB — TYPE AND SCREEN
ABO/RH(D): A POS
Antibody Screen: NEGATIVE

## 2023-04-20 LAB — RPR: RPR Ser Ql: NONREACTIVE

## 2023-04-20 MED ORDER — SIMETHICONE 80 MG PO CHEW: 80.0000 mg | CHEWABLE_TABLET | ORAL | Status: DC | PRN

## 2023-04-20 MED ORDER — ACETAMINOPHEN 325 MG PO TABS: 650.0000 mg | ORAL_TABLET | ORAL | Status: DC | PRN

## 2023-04-20 MED ORDER — DIBUCAINE (PERIANAL) 1 % EX OINT: 1.0000 | TOPICAL_OINTMENT | CUTANEOUS | Status: DC | PRN

## 2023-04-20 MED ORDER — LIDOCAINE HCL (PF) 1 % IJ SOLN: 30.0000 mL | INTRAMUSCULAR | Status: DC | PRN

## 2023-04-20 MED ORDER — LACTATED RINGERS IV SOLN: 500.0000 mL | INTRAVENOUS | Status: DC | PRN

## 2023-04-20 MED ORDER — OXYTOCIN 10 UNIT/ML IJ SOLN: 10.0000 [IU] | Freq: Once | INTRAMUSCULAR | Status: AC

## 2023-04-20 MED ORDER — SOD CITRATE-CITRIC ACID 500-334 MG/5ML PO SOLN: 30.0000 mL | ORAL | Status: DC | PRN

## 2023-04-20 MED ORDER — TETANUS-DIPHTH-ACELL PERTUSSIS 5-2.5-18.5 LF-MCG/0.5 IM SUSY: 0.5000 mL | PREFILLED_SYRINGE | Freq: Once | INTRAMUSCULAR | Status: DC

## 2023-04-20 MED ORDER — OXYTOCIN BOLUS FROM INFUSION: 333.0000 mL | Freq: Once | INTRAVENOUS | Status: DC

## 2023-04-20 MED ORDER — ZOLPIDEM TARTRATE 5 MG PO TABS: 5.0000 mg | ORAL_TABLET | Freq: Every evening | ORAL | Status: DC | PRN

## 2023-04-20 MED ORDER — ONDANSETRON HCL 4 MG/2ML IJ SOLN: 4.0000 mg | Freq: Four times a day (QID) | INTRAMUSCULAR | Status: DC | PRN

## 2023-04-20 MED ORDER — ONDANSETRON HCL 4 MG/2ML IJ SOLN: 4.0000 mg | INTRAMUSCULAR | Status: DC | PRN

## 2023-04-20 MED ORDER — WITCH HAZEL-GLYCERIN EX PADS: 1.0000 | MEDICATED_PAD | CUTANEOUS | Status: DC | PRN

## 2023-04-20 MED ORDER — LACTATED RINGERS IV SOLN: INTRAVENOUS | Status: DC

## 2023-04-20 MED ORDER — OXYCODONE-ACETAMINOPHEN 5-325 MG PO TABS: 1.0000 | ORAL_TABLET | ORAL | Status: DC | PRN

## 2023-04-20 MED ORDER — OXYCODONE-ACETAMINOPHEN 5-325 MG PO TABS: 2.0000 | ORAL_TABLET | ORAL | Status: DC | PRN

## 2023-04-20 MED ORDER — IBUPROFEN 600 MG PO TABS
600.0000 mg | ORAL_TABLET | Freq: Four times a day (QID) | ORAL | Status: DC
  Administered 2023-04-20 – 2023-04-21 (×6): 600 mg via ORAL
  Filled 2023-04-20 (×6): qty 1

## 2023-04-20 MED ORDER — ONDANSETRON HCL 4 MG PO TABS: 4.0000 mg | ORAL_TABLET | ORAL | Status: DC | PRN

## 2023-04-20 MED ORDER — BENZOCAINE-MENTHOL 20-0.5 % EX AERO: 1.0000 | INHALATION_SPRAY | CUTANEOUS | Status: DC | PRN

## 2023-04-20 MED ORDER — FENTANYL CITRATE (PF) 100 MCG/2ML IJ SOLN
50.0000 ug | INTRAMUSCULAR | Status: DC | PRN
  Administered 2023-04-20: 100 ug via INTRAVENOUS
  Filled 2023-04-20: qty 2

## 2023-04-20 MED ORDER — OXYTOCIN-SODIUM CHLORIDE 30-0.9 UT/500ML-% IV SOLN: 2.5000 [IU]/h | INTRAVENOUS | Status: DC

## 2023-04-20 MED ORDER — FENTANYL CITRATE (PF) 100 MCG/2ML IJ SOLN: 50.0000 ug | INTRAMUSCULAR | Status: DC | PRN

## 2023-04-20 MED ORDER — ACETAMINOPHEN 325 MG PO TABS
650.0000 mg | ORAL_TABLET | ORAL | Status: DC | PRN
  Administered 2023-04-20: 650 mg via ORAL
  Filled 2023-04-20: qty 2

## 2023-04-20 MED ORDER — OXYTOCIN 10 UNIT/ML IJ SOLN
INTRAMUSCULAR | Status: AC
  Administered 2023-04-20: 10 [IU] via INTRAMUSCULAR
  Filled 2023-04-20: qty 1

## 2023-04-20 MED ORDER — PRENATAL MULTIVITAMIN CH
1.0000 | ORAL_TABLET | Freq: Every day | ORAL | Status: DC
  Administered 2023-04-20 – 2023-04-21 (×2): 1 via ORAL
  Filled 2023-04-20 (×2): qty 1

## 2023-04-20 MED ORDER — SENNOSIDES-DOCUSATE SODIUM 8.6-50 MG PO TABS
2.0000 | ORAL_TABLET | Freq: Every day | ORAL | Status: DC
  Administered 2023-04-21: 2 via ORAL
  Filled 2023-04-20: qty 2

## 2023-04-20 MED ORDER — COCONUT OIL OIL: 1.0000 | TOPICAL_OIL | Status: DC | PRN

## 2023-04-20 MED ORDER — DIPHENHYDRAMINE HCL 25 MG PO CAPS: 25.0000 mg | ORAL_CAPSULE | Freq: Four times a day (QID) | ORAL | Status: DC | PRN

## 2023-04-20 NOTE — MAU Note (Addendum)
At 0200 Pt came in very uncomfortable with ctxs. Pt changed and got into bed and sve done. Pt 8/100/0 and intact. Pt in good control. Dr Earlene Plater called and will put in admit  orders. Jenn Mbugwa RN CN on BS called by [facility].  Pt to 214 via stretcher with Dorathy Daft CNM attending pt for transfer on stretcher. FHTs 140 before transfer to [facility]

## 2023-04-20 NOTE — Discharge Summary (Signed)
Postpartum Discharge Summary     Patient Name: Brooke Patel DOB: Dec 26, 1995 MRN: [id number]  Date of admission: 04/20/2023 Delivery date:04/20/2023 Delivering provider: Carlynn Herald Date of discharge: 04/21/2023  Admitting diagnosis: Pregnancy at 39/2. Active labor. 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 yo G5P5005 at [redacted]w[redacted]d was admitted in Active Labor on 04/20/2023. Labor course was complicated by perceptions delivery  Membrane Rupture Time/Date: 2:07 AM,04/20/2023  Delivery Method:Vaginal, Spontaneous Operative Delivery:N/A Episiotomy: None Lacerations:  None Patient had a postpartum course was uncomplicated. She is ambulating, tolerating a regular diet, passing flatus, and urinating well. Patient is discharged home in stable condition on 04/21/23.  She was not kept on her pregnancy metformin PP. Will need routine PP GTT in 4-6 weeks.   Newborn Data: Birth date:04/20/2023 Birth time:2:07 AM Gender:Female Living status:Living Apgars:9 ,9  Weight:3030 g  Magnesium Sulfate received: No BMZ received: No Rhophylac:N/A MMR:N/A Transfusion:No  Immunizations received: Immunization History  Administered Date(s) Administered   Influenza,inj,Quad PF,6+ Mos 04/23/2019, 05/19/2021   Tdap 04/02/2011, 09/27/2019, 02/04/2021    Physical exam  Vitals:   04/20/23  1230 04/20/23 1435 04/20/23 2218 04/21/23 0509  BP: 114/68 117/74 110/70 113/77  Pulse: 70 67 85 65  Resp: 16 16 17 16   Temp: 98.2 F (36.8 C) 98.4 F (36.9 C) 98.8 F (37.1 C) 98.5 F (36.9 C)  TempSrc: Oral Oral Oral Oral  SpO2:  99% 100% 99%   General: alert Lochia: appropriate Uterine Fundus: firm, nttp, below the umbilicus Incision: N/A DVT Evaluation: No evidence of DVT seen on physical exam. Labs: Lab Results  Component Value Date   WBC 11.4 (H) 04/20/2023   HGB 9.3 (L) 04/20/2023   HCT 29.5 (L) 04/20/2023   MCV 81.3 04/20/2023   PLT 171 04/20/2023      Latest Ref Rng & Units 04/20/2023    2:42 AM  CMP  Glucose 70 - 99 mg/dL 644   BUN 6 - 20 mg/dL 14   Creatinine 0.34 - 1.00 mg/dL 7.42   Sodium 595 - 638 mmol/L 133   Potassium 3.5 - 5.1 mmol/L 3.6   Chloride 98 - 111 mmol/L 103   CO2 22 - 32 mmol/L 19   Calcium 8.9 - 10.3 mg/dL 8.7   Total Protein 6.5 - 8.1 g/dL 6.6   Total Bilirubin 0.3 - 1.2 mg/dL 0.4   Alkaline Phos 38 - 126 U/L 110   AST 15 - 41 U/L 22   ALT 0 - 44 U/L 13    Edinburgh Score:    04/20/2023    7:38 PM  Edinburgh Postnatal Depression Scale Screening Tool  I have been able to laugh and see the funny side of  things. 0  I have looked forward with enjoyment to things. 0  I have blamed myself unnecessarily when things went wrong. 0  I have been anxious or worried for no good reason. 0  I have felt scared or panicky for no good reason. 0  Things have been getting on top of me. 0  I have been so unhappy that I have had difficulty sleeping. 0  I have felt sad or miserable. 0  I have been so unhappy that I have been crying. 0  The thought of harming myself has occurred to me. 0  Edinburgh Postnatal Depression Scale Total 0   Edinburgh Postnatal Depression Scale Total: 0   After visit meds:  Allergies as of 04/21/2023       Reactions   Venofer [iron Sucrose] Itching, Rash        Medication List     STOP taking these medications     Accu-Chek Guide test strip Generic drug: glucose blood   Accu-Chek Guide w/Device Kit   Accu-Chek Softclix Lancets lancets   aspirin 81 MG chewable tablet   metFORMIN 500 MG tablet Commonly known as: GLUCOPHAGE       TAKE these medications    ACCRUFeR 30 MG Caps Generic drug: Ferric Maltol Take 1 capsule (30 mg total) by mouth 2 (two) times daily. taken 1 hour before or 2 hours after meal   acetaminophen 325 MG tablet Commonly known as: Tylenol Take 2 tablets (650 mg total) by mouth every 4 (four) hours as needed (for pain scale < 4).   Blood Pressure Kit Devi 1 Device by Does not apply route as needed. ICD 10 Z34.90   ibuprofen 600 MG tablet Commonly known as: ADVIL Take 1 tablet (600 mg total) by mouth every 6 (six) hours as needed.   prenatal vitamin w/FE, FA 29-1 MG Chew chewable tablet Chew 1 tablet by mouth daily at 12 noon.         Discharge home in stable condition Infant Feeding: Breast Infant Disposition:home with mother Discharge instruction: per After Visit Summary and Postpartum booklet. Activity: Advance as tolerated. Pelvic rest for 6 weeks.  Diet: routine diet Future Appointments: Future Appointments  Date Time Provider Department Center  06/01/2023  8:15 AM Gerrit Heck, CNM [facility] None   Follow up Visit:  Follow-up Information     [facility]. Go on 06/01/2023.   Contact information: [address] [place] [address] [phone]                 Please schedule this patient for a Virtual postpartum visit in 4 weeks with the following provider: Any provider. Additional Postpartum F/U: N/A    Low risk pregnancy complicated by:  N/A  Delivery mode:  Vaginal, Spontaneous Anticipated Birth Control:  IUD outpatient  Message sent on 9/25 @ 0241 04/21/2023 Maitland Bing, MD

## 2023-04-20 NOTE — H&P (Signed)
LABOR AND DELIVERY ADMISSION HISTORY AND PHYSICAL NOTE  Brooke Patel is a 27 y.o. female [phone] with IUP at [redacted]w[redacted]d presenting for SOL.   Patient reports the fetal movement as active. Patient reports uterine contraction  activity as regular. Patient reports  vaginal bleeding as none. Patient describes fluid per vagina as None.   Patient denies headache, vision changes, chest pain, shortness of breath, right upper quadrant pain, or LE edema.  She plans on breast feeding feeding. Her contraception plan is:  OP IUD .  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    Past Medical History: Past Medical History:  Diagnosis Date   Anemia    Anemia    GAD (generalized anxiety disorder) 02/17/2018   Group B Streptococcus carrier, +RV culture, currently pregnant 04/03/2021   Will treat in labor   Hypertension    in pregnancy   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   Preeclampsia in postpartum period 04/23/2021   Short interval between pregnancies affecting pregnancy, antepartum 09/24/2020   Supervision of low-risk pregnancy 04/09/2019    Nursing Staff Provider Office Location  [facility] Dating   LMP Language  English Anatomy US   ordered Flu Vaccine   declined 04-09-21 Genetic Screen  NIPS: low risk   AFP:   Screen negative  TDaP Vaccine   02/04/21 Hgb A1C or  GTT Early  Third trimester Normal   Glucose, Fasting 65 - 91 mg/dL 89  Glucose, 1 hour 65 - 179 mg/dL 454  Glucose, 2 hour 65 - 152 mg/dL 098  COVID Vaccine no   LAB RESULTS  Rhog   Supervision of other normal  pregnancy, antepartum 09/22/2022              Nursing Staff    Provider      Office Location    [facility]    Dating     04/18/2023, by Last Menstrual Period      [facility] Model    [ x ] Traditional  [ ]  Centering  [ ]  Mom-Baby Dyad                Language     English    Anatomy US     19 weeks reassess anatomy at 24 weeks      Flu Vaccine      No    Genetic/Carrier Screen     NIPS:   negative  AFP:   negative  Horizon:      TDaP Vaccine        Past Surgical History: Past Surgical History:  Procedure Laterality Date   NO PAST SURGERIES      Obstetrical History: OB History     Gravida  5   Para  4   Term  4   Preterm  0   AB      Living  4      SAB  0   IAB  0   Ectopic  0   Multiple  0   Live Births  4           Social History: Social History   Socioeconomic  History   Marital status: Single    Spouse name: Not on file   Number of children: Not on file   Years of education: 10   Highest education level: Not on file  Occupational History   Not on file  Tobacco Use   Smoking status: Never   Smokeless tobacco: Never  Vaping Use   Vaping status: Never Used  Substance and Sexual Activity   Alcohol use: No   Drug use: No   Sexual activity: Yes    Birth control/protection: None  Other Topics Concern   Not on file  Social History Narrative   Not on file   Social Determinants of Health   Financial Resource Strain: Low Risk  (09/07/2022)   Received from [facility], [facility]   Overall Financial Resource Strain (CARDIA)    Difficulty of Paying Living Expenses: Not very hard  Food Insecurity: No Food Insecurity (09/07/2022)   Received from [facility], [facility]   Hunger Vital Sign    Worried About Running Out of Food in the Last Year: Never true    Ran Out of Food in the Last Year: Never true  Transportation Needs: No Transportation Needs (09/07/2022)   Received from [facility], [facility]   PRAPARE - Transportation    Lack of Transportation  (Medical): No    Lack of Transportation (Non-Medical): No  Physical Activity: Insufficiently Active (09/07/2022)   Received from [facility], [facility]   Exercise Vital Sign    Days of Exercise per Week: 2 days    Minutes of Exercise per Session: 20 min  Stress: No Stress Concern Present (09/07/2022)   Received from [facility], [facility] of Occupational Health - Occupational Stress Questionnaire    Feeling of Stress : Not at all  Social Connections: Moderately Integrated (09/07/2022)   Received from [facility], [facility]   Social Network    How would you rate your social network (family, work, friends)?: Adequate participation with social networks    Family History: Family History  Problem Relation Age of Onset   Diabetes Paternal Grandmother    Cancer Paternal Grandmother    Cancer Sister     Allergies: Allergies  Allergen Reactions   Venofer [Iron Sucrose] Itching and Rash    Medications Prior to Admission  Medication Sig Dispense Refill Last Dose   Accu-Chek Softclix Lancets lancets Use to check blood sugars 4 times a day as instructed 100 each 12    aspirin 81 MG chewable tablet Chew 1 tablet (81 mg total) by mouth daily. 30 tablet 6    Blood Glucose Monitoring Suppl (ACCU-CHEK GUIDE) w/Device KIT 1 Device by Does not apply route 4 (four) times daily. 1 kit 0    Blood Pressure Monitoring (BLOOD PRESSURE KIT) DEVI 1 Device by Does not apply route as needed. ICD 10 Z34.90 (Patient not taking: Reported on 03/16/2023) 1 Device 0    Ferric Maltol (ACCRUFER) 30 MG CAPS Take 1 capsule (30 mg total) by mouth 2 (two) times daily. taken 1 hour before or 2 hours after meal 60 capsule 2    glucose blood (ACCU-CHEK GUIDE) test strip Use to check blood sugars four times a day was instructed 50 each 12    metFORMIN (GLUCOPHAGE) 500 MG tablet Take 1 tablet (500 mg total) by mouth daily with breakfast. 60 tablet 5    prenatal vitamin w/FE, FA (NATACHEW)  29-1 MG CHEW chewable tablet Chew 1 tablet by mouth  daily at 12 noon. 30 tablet 11      Review of Systems  All systems reviewed and negative except as stated in HPI  Physical Exam BP (!) 134/90   Pulse 72   LMP 07/12/2022 (Exact Date)   Physical Exam Constitutional:      General: She is not in acute distress. Cardiovascular:     Rate and Rhythm: Normal rate and regular rhythm.  Pulmonary:     Effort: Pulmonary effort is normal.  Abdominal:     Comments: Gravid  Neurological:     Mental Status: Mental status is at baseline.  Psychiatric:        Mood and Affect: Mood normal.   Presentation: cephalic by check  Fetal monitoring: Baseline: 140 bpm, Variability: Good {> 6 bpm), Accelerations: Reactive, and Decelerations: Absent Uterine activity: regular  Dilation: 10 Dilation Complete Date: 04/20/23 Dilation Complete Time: 0207 Effacement (%): 100 Station: Crowning Presentation: Vertex Exam by:: Suzie Portela, CNM  Prenatal labs: ABO, Rh: --/--/A POS (09/25 0242) Antibody: NEG (09/25 0242) Rubella: 1.12 (02/28 1649) RPR: Non Reactive (07/10 0942)  HBsAg: Negative (02/28 1649)  HIV: Non Reactive (07/10 0942)  GC/Chlamydia:  Neisseria Gonorrhea  Date Value Ref Range Status  04/04/2023 Negative  Final   Chlamydia  Date Value Ref Range Status  04/04/2023 Negative  Final   GBS: Negative/-- (09/09 1636)    Prenatal Transfer Tool  Maternal Diabetes: Yes:  Diabetes Type:  Insulin/Medication controlled Genetic Screening: Normal Maternal Ultrasounds/Referrals: Normal Fetal Ultrasounds or other Referrals:  None Maternal Substance Abuse:  No Significant Maternal Medications:  metformin 500 mg QAM Significant Maternal Lab Results: Group B Strep negative  Results for orders placed or performed during the hospital encounter of 04/20/23 (from the past 24 hour(s))  CBC   Collection Time: 04/20/23  2:42 AM  Result Value Ref Range   WBC 11.4 (H) 4.0 - 10.5 K/uL   RBC 3.63 (L)  3.87 - 5.11 MIL/uL   Hemoglobin 9.3 (L) 12.0 - 15.0 g/dL   HCT 56.2 (L) 13.0 - 86.5 %   MCV 81.3 80.0 - 100.0 fL   MCH 25.6 (L) 26.0 - 34.0 pg   MCHC 31.5 30.0 - 36.0 g/dL   RDW 78.4 (H) 69.6 - 29.5 %   Platelets 171 150 - 400 K/uL   nRBC 0.2 0.0 - 0.2 %  Comprehensive metabolic panel   Collection Time: 04/20/23  2:42 AM  Result Value Ref Range   Sodium 133 (L) 135 - 145 mmol/L   Potassium 3.6 3.5 - 5.1 mmol/L   Chloride 103 98 - 111 mmol/L   CO2 19 (L) 22 - 32 mmol/L   Glucose, Bld 101 (H) 70 - 99 mg/dL   BUN 14 6 - 20 mg/dL   Creatinine, Ser 2.84 0.44 - 1.00 mg/dL   Calcium 8.7 (L) 8.9 - 10.3 mg/dL   Total Protein 6.6 6.5 - 8.1 g/dL   Albumin 2.8 (L) 3.5 - 5.0 g/dL   AST 22 15 - 41 U/L   ALT 13 0 - 44 U/L   Alkaline Phosphatase 110 38 - 126 U/L   Total Bilirubin 0.4 0.3 - 1.2 mg/dL   GFR, Estimated >13 >24 mL/min   Anion gap 11 5 - 15  Type and screen   Collection Time: 04/20/23  2:42 AM  Result Value Ref Range   ABO/RH(D) A POS    Antibody Screen NEG    Sample Expiration      04/23/2023,2359 Performed at [facility]  [facility], [address], [place], [place] [address]     Assessment: Brooke Patel is a 27 y.o. G5P4004 at [redacted]w[redacted]d here for SOL  #Patient delivered precipitously upon arrival to L&D floor #GBS/ID: Negative #MOF: bottle feeding #MOC: IUD #Circ: No  #GDM: fasting AM, 2 hr GTT PP  Joanne Gavel, MD [facility] Fellow Center for [facility], [facility]  04/20/2023, 3:42 AM

## 2023-04-20 NOTE — Plan of Care (Signed)
Progressing

## 2023-04-21 ENCOUNTER — Encounter: Payer: Medicaid Other | Admitting: Obstetrics & Gynecology

## 2023-04-21 MED ORDER — IBUPROFEN 600 MG PO TABS: 600.0000 mg | ORAL_TABLET | Freq: Four times a day (QID) | ORAL | 0 refills | Status: AC | PRN

## 2023-04-21 MED ORDER — ACETAMINOPHEN 325 MG PO TABS: 650.0000 mg | ORAL_TABLET | Freq: Four times a day (QID) | ORAL | 0 refills | Status: AC | PRN

## 2023-04-21 MED ORDER — IBUPROFEN 600 MG PO TABS: 600.0000 mg | ORAL_TABLET | Freq: Four times a day (QID) | ORAL | Status: DC | PRN

## 2023-04-21 MED ORDER — ACETAMINOPHEN 325 MG PO TABS: 650.0000 mg | ORAL_TABLET | ORAL | Status: DC | PRN

## 2023-04-21 NOTE — Progress Notes (Signed)
POSTPARTUM PROGRESS NOTE  Post Partum Day 1  Subjective:  Brooke Patel is a 27 y.o. B1Y7829 s/p SVD at [redacted]w[redacted]d.  She reports she is doing well. No acute events overnight. She denies any problems with ambulating, voiding or po intake. Denies nausea or vomiting.  Pain is well controlled.  Lochia is < menses.  Objective: Blood pressure 113/77, pulse 65, temperature 98.5 F (36.9 C), temperature source Oral, resp. rate 16, last menstrual period 07/12/2022, SpO2 99%, unknown if currently breastfeeding.  Physical Exam:  General: alert, cooperative and no distress Chest: no respiratory distress Heart:regular rate, distal pulses intact Uterine Fundus: firm, appropriately tender DVT Evaluation: No calf swelling or tenderness Extremities: trace edema Skin: warm, dry  Recent Labs    04/20/23 0242  HGB 9.3*  HCT 29.5*    Assessment/Plan: Brooke Patel is a 27 y.o. F6O1308 s/p SVD at [redacted]w[redacted]d   PPD#1 - Doing well  Routine postpartum care Contraception: OP IUD Feeding: bottle Dispo: Plan for discharge 9/27, unless cleared by PEDs 9/26 and patient feeling stable for d/c.   LOS: 1 day   Hessie Dibble, MD OB Fellow  04/21/2023, 8:09 AM

## 2023-04-21 NOTE — Progress Notes (Signed)
MOB was referred for history of depression/anxiety.  * Referral screened out by Clinical Social Worker because none of the following criteria appear to apply:  ~ History of anxiety/depression during this pregnancy, or of post-partum depression following prior delivery.  ~ Diagnosis of anxiety and/or depression within last 3 years  Per OB notes, MOB did not indicate any signs/ symptoms during pregnancy. She was diagnosed in 2018.  OR  * MOB's symptoms currently being treated with medication and/or therapy.  Please contact the Clinical Social Worker if needs arise, by [facility] request, or if MOB scores greater than 9/yes to question 10 on Edinburgh Postpartum Depression Screen.   Enos Fling, Theresia Majors Clinical Social Worker [phone]

## 2023-04-26 ENCOUNTER — Other Ambulatory Visit: Payer: Self-pay

## 2023-04-26 DIAGNOSIS — O99013 Anemia complicating pregnancy, third trimester: Secondary | ICD-10-CM

## 2023-04-26 MED ORDER — ACCRUFER 30 MG PO CAPS: 1.0000 | ORAL_CAPSULE | Freq: Two times a day (BID) | ORAL | 2 refills | Status: AC

## 2023-04-28 ENCOUNTER — Encounter: Payer: Medicaid Other | Admitting: Obstetrics and Gynecology

## 2023-05-17 ENCOUNTER — Telehealth (HOSPITAL_COMMUNITY): Payer: Self-pay | Admitting: *Deleted

## 2023-05-17 NOTE — Telephone Encounter (Signed)
05/17/2023  Name: Brooke Patel MRN: [id number] DOB: Jul 04, 1996  Reason for Call:  Transition of Care Hospital Discharge Call  Contact Status: Patient Contact Status: Message  Language assistant needed:          Follow-Up Questions:    Inocente Salles Postnatal Depression Scale:  In the Past 7 Days:    PHQ2-9 Depression Scale:     Discharge Follow-up:    Post-discharge interventions: NA  Salena Saner, RN 05/16/2024 13:28

## 2023-06-01 ENCOUNTER — Ambulatory Visit (INDEPENDENT_AMBULATORY_CARE_PROVIDER_SITE_OTHER): Payer: Medicaid Other

## 2023-06-01 DIAGNOSIS — O99013 Anemia complicating pregnancy, third trimester: Secondary | ICD-10-CM

## 2023-06-01 DIAGNOSIS — Z30011 Encounter for initial prescription of contraceptive pills: Secondary | ICD-10-CM | POA: Diagnosis not present

## 2023-06-01 DIAGNOSIS — O24419 Gestational diabetes mellitus in pregnancy, unspecified control: Secondary | ICD-10-CM

## 2023-06-01 MED ORDER — NORETHIN-ETH ESTRAD-FE BIPHAS 1 MG-10 MCG / 10 MCG PO TABS: 1.0000 | ORAL_TABLET | Freq: Every day | ORAL | 3 refills | Status: AC

## 2023-06-01 NOTE — Progress Notes (Signed)
Pt. Presents for Postpartum care. Pt. Broke out in hives on 9/23 from an iron infusion. Pt. Wants to know about taking her Ferric Maltol because of it.  Pt. Wants to talk about taking the Pill for birth control.

## 2023-06-01 NOTE — Progress Notes (Signed)
Post Partum Visit Note  Brooke Patel is a 27 y.o. O1H0865 female who presents for a postpartum visit. She is 6 weeks postpartum following a normal spontaneous vaginal delivery.  I have fully reviewed the prenatal and intrapartum course. The delivery was at 39 gestational weeks.  Anesthesia: none. Postpartum course has been good. Baby is doing well yes. Baby is feeding by bottle - Similac total comfort . Bleeding no bleeding. Bowel function is normal. Bladder function is normal. Patient is not sexually active. Contraception method is none. Postpartum depression screening: negative.  Patient reports some feeling of dizziness that is random. She has not been taking iron supplement, but is taking her prenatal vitamin.   No issues with breast.  She receives infant care from her husband.  She denies feelings of depression.  No sexual activity and is no longer bleeding. She reports desire for birth control in the form of pills.   The pregnancy intention screening data noted above was reviewed. Potential methods of contraception were discussed. The patient elected to proceed with No data recorded.   Edinburgh Postnatal Depression Scale - 06/01/23 0826       Edinburgh Postnatal Depression Scale:  In the Past 7 Days   I have been able to laugh and see the funny side of things. 0    I have looked forward with enjoyment to things. 0    I have blamed myself unnecessarily when things went wrong. 0    I have been anxious or worried for no good reason. 0    I have felt scared or panicky for no good reason. 0    Things have been getting on top of me. 0    I have been so unhappy that I have had difficulty sleeping. 0    I have felt sad or miserable. 0    I have been so unhappy that I have been crying. 0    The thought of harming myself has occurred to me. 0    Edinburgh Postnatal Depression Scale Total 0             Health Maintenance Due  Topic Date Due   INFLUENZA VACCINE  02/24/2023    COVID-19 Vaccine (1 - 2023-24 season) Never done    The following portions of the patient's history were reviewed and updated as appropriate: allergies, current medications, past family history, past medical history, past social history, past surgical history, and problem list.  Review of Systems Pertinent items are noted in HPI.  Objective:  BP 117/78   Pulse 78   Ht 5\' 3"  (1.6 m)   Wt 151 lb (68.5 kg)   LMP 07/12/2022 (Exact Date)   Breastfeeding No   BMI 26.75 kg/m    General:  alert, cooperative, and no distress   Breasts:  not indicated  Lungs: No respiratory distress noted  Heart:  Regular pulse  Abdomen: Soft    Wound N/A  GU exam:  not indicated       Assessment:  Postpartum state-Normal Involution Bottle Feeding Gestational diabetes mellitus (GDM) Anemia during pregnancy Encounter for oral contraception initial prescription  Plan:   Doing well. Okay to resume normal activities as tolerated. Discussed returning for GTT in one week as not scheduled today. Reassured that okay to take oral iron especially since taken in the past without issues. Will check H&H today. Plan to start on LoLo oral contraception. Instructed to use condoms for first 7 days and when missed by more  than 48 hours. RTO in one year for annual exam or prn. Pregnancy problems resolved as appropriate.   Essential components of care per ACOG recommendations:  1.  Mood and well being: Patient with negative depression screening today. Reviewed local resources for support.  - Patient tobacco use? No.   - hx of drug use? No.    2. Infant care and feeding:  -Patient currently breastmilk feeding? No.  -Social determinants of health (SDOH) reviewed in EPIC. No concerns  3. Sexuality, contraception and birth spacing - Patient does not want a pregnancy in the next year.  Desired family size is 5 children.  - Reviewed reproductive life planning. Reviewed contraceptive methods based on pt  preferences and effectiveness.  Patient desired Oral Contraceptive today.   - Discussed birth spacing of 18 months  4. Sleep and fatigue -Encouraged family/partner/community support of 4 hrs of uninterrupted sleep to help with mood and fatigue  5. Physical Recovery  - Discussed patients delivery and complications. She describes her labor as good. Patient arrived at 8cm and delivered almost immediately. Reflects, happily, on urge to push  - Patient had a Vaginal, no problems at delivery. Patient had a  no  laceration. Perineal healing reviewed. Patient expressed understanding - Patient has urinary incontinence? No. - Patient is safe to resume physical and sexual activity  6.  Health Maintenance - HM due items addressed Yes - Last pap smear  Diagnosis  Date Value Ref Range Status  10/20/2022   Final   - Negative for Intraepithelial Lesions or Malignancy (NILM)  10/20/2022 - Benign reactive/reparative changes  Final   Pap smear not done at today's visit.  -Breast Cancer screening indicated? No.   7. Chronic Disease/Pregnancy Condition follow up: Anemia and Gestational Diabetes -H& H collected today. -GDM during pregnancy. Not scheduled for GTT. Will have return to office in 1-2 weeks.   Cherre Robins, CNM Center for [employer], [facility]

## 2023-06-02 LAB — HEMOGLOBIN AND HEMATOCRIT, BLOOD
Hematocrit: 38.3 % (ref 34.0–46.6)
Hemoglobin: 12.2 g/dL (ref 11.1–15.9)

## 2023-06-10 ENCOUNTER — Other Ambulatory Visit: Payer: Medicaid Other

## 2023-06-29 ENCOUNTER — Other Ambulatory Visit: Payer: Self-pay

## 2024-08-09 ENCOUNTER — Other Ambulatory Visit: Payer: Self-pay

## 2024-08-09 ENCOUNTER — Ambulatory Visit
Admission: RE | Admit: 2024-08-09 | Discharge: 2024-08-09 | Disposition: A | Discharge status: Home / Self Care | Attending: Physician Assistant

## 2024-08-09 VITALS — BP 130/88 | HR 119 | Temp 101.9°F | Resp 17 | Ht 63.0 in

## 2024-08-09 DIAGNOSIS — J101 Influenza due to other identified influenza virus with other respiratory manifestations: Secondary | ICD-10-CM

## 2024-08-09 DIAGNOSIS — R0989 Other specified symptoms and signs involving the circulatory and respiratory systems: Secondary | ICD-10-CM

## 2024-08-09 LAB — POCT INFLUENZA A/B
Influenza A, POC: NEGATIVE
Influenza B, POC: POSITIVE — AB

## 2024-08-09 LAB — POC SOFIA SARS ANTIGEN FIA: SARS Coronavirus 2 Ag: NEGATIVE

## 2024-08-09 MED ORDER — ACETAMINOPHEN 325 MG PO TABS
650.0000 mg | ORAL_TABLET | Freq: Once | ORAL | Status: AC
  Administered 2024-08-09: 650 mg via ORAL

## 2024-08-09 MED ORDER — ONDANSETRON 4 MG PO TBDP: 4.0000 mg | ORAL_TABLET | Freq: Three times a day (TID) | ORAL | 0 refills | Status: AC | PRN

## 2024-08-09 NOTE — ED Provider Notes (Signed)
 " [facility]    CSN: [id number] Arrival date & time: 08/09/24  1814      History   Chief Complaint Chief Complaint  Patient presents with   Cough    Ear painChest painThroat painChills - Entered by patient    HPI Lorriann Hansmann is a 29 y.o. female.   HPI  Pt is here today with concerns for coughing, abdominal pain, body aches, headaches, fever and chills  She reports her cough is predominantly dry. 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   Preeclampsia in postpartum period 04/23/2021   Short interval between pregnancies affecting pregnancy, antepartum 09/24/2020   Supervision of low-risk pregnancy 04/09/2019    Nursing Staff Provider Office Location  [facility] Dating   LMP Language  English Anatomy US    ordered Flu Vaccine   declined 04-09-21 Genetic Screen  NIPS: low risk   AFP:   Screen negative  TDaP Vaccine   02/04/21 Hgb A1C or  GTT Early  Third trimester Normal   Glucose, Fasting 65 - 91 mg/dL 89  Glucose, 1 hour 65 - 179 mg/dL 825  Glucose, 2 hour 65 - 152 mg/dL 853  COVID Vaccine no   LAB RESULTS  Rhog   Supervision of other normal pregnancy, antepartum 09/22/2022              Nursing Staff    Provider      Office Location    [facility]    Dating     04/18/2023, by Last Menstrual Period      [facility] Model    [ x ] Traditional  [ ]  Centering  [ ]  Mom-Baby Dyad                Language     English    Anatomy US      19 weeks reassess anatomy at 24 weeks      Flu Vaccine      No    Genetic/Carrier Screen     NIPS:    negative  AFP:   negative  Horizon:      TDaP Vaccine        Patient Active Problem List   Diagnosis Date Noted   Gestational diabetes mellitus, antepartum 04/20/2023   Anemia in pregnancy 02/14/2023   Hx of preeclampsia, prior pregnancy, currently pregnant 10/20/2022   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 Authorizing Provider  ondansetron  (ZOFRAN -ODT) 4 MG disintegrating tablet Take 1 tablet (4 mg total) by mouth every 8 (eight) hours as needed for nausea or vomiting. 08/09/24  Yes Nirvi Boehler E, PA-C  acetaminophen  (TYLENOL ) 325 MG tablet Take 2 tablets (650 mg total) by mouth every 6 (six) hours as needed (for pain scale < 4). Patient not taking: Reported on 06/01/2023 04/21/23   Kumar, Agnijita, MD  Blood Pressure Monitoring (BLOOD PRESSURE KIT) DEVI 1 Device by Does not apply route as needed. ICD 10 Z34.90 Patient not taking: Reported on 03/16/2023 04/09/19   Sid Veva CROME, NP  Ferric Maltol  (ACCRUFER ) 30 MG CAPS Take 1 capsule (30 mg total) by mouth 2 (two) times daily. taken 1 hour before or 2 hours after meal Patient not taking: Reported on 06/01/2023 04/26/23   Erik Kieth BROCKS, MD  ibuprofen  (ADVIL ) 600 MG tablet Take 1 tablet (600 mg total) by mouth every 6 (six) hours as needed. Patient not taking: Reported on 06/01/2023 04/21/23   Kumar, Agnijita, MD  Norethindrone-Ethinyl Estradiol -Fe Biphas (LO LOESTRIN FE) 1 MG-10 MCG / 10 MCG tablet Take 1 tablet by mouth daily. 06/01/23   Synthia Raisin, CNM  prenatal vitamin w/FE, FA (NATACHEW) 29-1 MG CHEW chewable tablet Chew 1 tablet by mouth daily at 12 noon. 09/22/22   Lorence Ozell CROME, MD    Family History Family History  Problem Relation Age of Onset   Diabetes Paternal Grandmother    Cancer Paternal Grandmother    Cancer Sister     Social History Social History[1]   Allergies   Venofer  [iron  sucrose]   Review of Systems Review of Systems  Constitutional:  Positive for chills and fever.  HENT:  Positive for rhinorrhea and sore throat. Negative for congestion and postnasal drip.   Respiratory:  Positive for cough. Negative for shortness of breath and wheezing.   Gastrointestinal:  Positive for abdominal pain and nausea. Negative for diarrhea and vomiting.  Musculoskeletal:  Positive for myalgias.  Neurological:  Positive for headaches.     Physical Exam Triage Vital Signs ED Triage Vitals  Encounter Vitals Group     BP 08/09/24 1855 130/88     Girls Systolic BP Percentile --      Girls Diastolic BP Percentile --      Boys Systolic BP Percentile --      Boys Diastolic BP Percentile --      Pulse Rate 08/09/24 1855 (!) 119     Resp 08/09/24 1855 17     Temp 08/09/24 1855 (!) 101.9 F (38.8 C)     Temp Source 08/09/24 1855 Oral     SpO2 08/09/24 1855 96 %     Weight --      Height 08/09/24 1852 5' 3 (1.6 m)     Head Circumference --      Peak Flow --      Pain Score 08/09/24 1852 6     Pain Loc --      Pain Education --      Exclude from Growth Chart --    No data found.  Updated Vital Signs BP 130/88 (BP Location: Right Arm)   Pulse (!) 119   Temp (!) 101.9 F (38.8 C) (Oral)   Resp 17   Ht 5' 3 (1.6 m)   LMP 08/05/2024 (Exact Date)   SpO2 96%   Breastfeeding No   BMI 26.75 kg/m   Visual Acuity Right  Eye Distance:   Left Eye Distance:   Bilateral Distance:    Right Eye Near:   Left Eye Near:    Bilateral Near:     Physical Exam Vitals reviewed.  Constitutional:      General: She is awake.     Appearance: Normal appearance. She is well-developed and well-groomed.  HENT:     Head: Normocephalic and  atraumatic.     Right Ear: Hearing, tympanic membrane and ear canal normal.     Left Ear: Hearing, tympanic membrane and ear canal normal.     Mouth/Throat:     Lips: Pink.     Mouth: Mucous membranes are moist.     Pharynx: Oropharynx is clear. Uvula midline. Posterior oropharyngeal erythema present. No pharyngeal swelling, oropharyngeal exudate, uvula swelling or postnasal drip.  Cardiovascular:     Rate and Rhythm: Normal rate and regular rhythm.     Pulses: Normal pulses.          Radial pulses are 2+ on the right side and 2+ on the left side.     Heart sounds: Normal heart sounds. No murmur heard.    No friction rub. No gallop.  Pulmonary:     Effort: Pulmonary effort is normal.     Breath sounds: Normal breath sounds. No decreased air movement. No decreased breath sounds, wheezing, rhonchi or rales.  Musculoskeletal:     Cervical back: Normal range of motion and neck supple.  Lymphadenopathy:     Head:     Right side of head: No submental, submandibular or preauricular adenopathy.     Left side of head: No submental, submandibular or preauricular adenopathy.     Cervical:     Right cervical: No superficial cervical adenopathy.    Left cervical: No superficial cervical adenopathy.     Upper Body:     Right upper body: No supraclavicular adenopathy.     Left upper body: No supraclavicular adenopathy.  Neurological:     Mental Status: She is alert.  Psychiatric:        Mood and Affect: Mood normal.        Behavior: Behavior normal. Behavior is cooperative.        Thought Content: Thought content normal.        Judgment: Judgment normal.      UC Treatments / Results  Labs (all labs ordered are listed, but only abnormal results are displayed) Labs Reviewed  POCT INFLUENZA A/B - Abnormal; Notable for the following components:      Result Value   Influenza B, POC Positive (*)    All other components within normal limits  POC SOFIA SARS ANTIGEN FIA     EKG   Radiology No results found.  Procedures Procedures (including critical care time)  Medications Ordered in UC Medications  acetaminophen  (TYLENOL ) tablet 650 mg (650 mg Oral Given 08/09/24 1901)    Initial Impression / Assessment and Plan / UC Course  I have reviewed the triage vital signs and the nursing notes.  Pertinent labs & imaging results that were available during my care of the patient were reviewed by me and considered in my medical decision making (see chart for details).      Final Clinical Impressions(s) / UC Diagnoses   Final diagnoses:  Symptoms of upper respiratory infection (URI)  Influenza B   Patient is here today for pain, abdominal pain, body aches fever and chills.  She states that her symptoms have been ongoing for about  3 days and her daughter sick with similar symptoms.  Rapid testing is positive for influenza B.  Results discussed with patient during visit and she declines offer of Tamiflu prescription for symptoms.  She also reports reduced p.o. intake due to decreased appetite and nausea.  Will send prescription for Zofran  4 mg p.o. 3 times daily as needed to assist with nausea and improve p.o. intake.  Reviewed appropriate OTC medications for further symptomatic relief.  ED and return precautions reviewed and provided in AVS.  Follow-up as needed.    Discharge Instructions      Your testing was positive for Flu B  Symptoms can last for 3-10 days with lingering cough and intermittent symptoms potentially  lasting several  weeks after that.  The goal of treatment at this time is to reduce your symptoms and discomfort   You can use the following medications and measures to help yourself feel better until your body fights this off: DayQuil/NyQuil, TheraFlu, Alka-Seltzer  (these medications typically have the same active ingredients in them so you can choose whichever one you prefer and take consistently during the day and night according to  the manufactures instructions.) Flonase A daily antihistamine such as Zyrtec, Claritin, Allegra per your preference.  Please choose 1 and take consistently. Increased fluids.  It is recommended that you take in at least 64 ounces of water per day when you are not sick so it is important to increase this when you are sick and your body may be running fever. Rest Cough drops Chloraseptic throat spray to help with sore throat Nasal saline spray or nasal flushes to help with congestion and runny nose  I have also sent in a medication called Zofran  to assist with your nausea and vomiting.  You can take this as needed up to every 8 hours.  Please be advised that the most common side effect of this medication is constipation.  If you start to develop the symptoms I recommend taking a few doses of a stool softener and increasing your fiber.  If needed you can also take a few doses of MiraLAX until you have regular bowel movements again.   If your symptoms seem like they are getting worse over the next 5 to 7 days or not improving you can always follow-up here in urgent care or go to your primary care provider for further management. Go to the ER if you begin to have more serious symptoms such as shortness of breath, trouble breathing, loss of consciousness, swelling around the eyes, high fever, severe lasting headaches, vision changes or neck pain/stiffness.       ED Prescriptions     Medication Sig Dispense Auth. Provider   ondansetron  (ZOFRAN -ODT) 4 MG disintegrating tablet Take 1 tablet (4 mg total) by mouth every 8 (eight) hours as needed for nausea or vomiting. 20 tablet Jolon Degante E, PA-C      PDMP not reviewed this encounter.      [1]  Social History Tobacco Use   Smoking status: Never   Smokeless tobacco: Never  Vaping Use   Vaping status: Never Used  Substance Use Topics   Alcohol use: No   Drug use: No     Marylene Rocky BRAVO, PA-C 08/12/24 [id number]  "

## 2024-08-09 NOTE — ED Triage Notes (Signed)
 Pt presents with c/o productive cough, generalized body aches, chills, sweats, headaches, loss of appetite, nausea, and pain in upper abdomen. Cough has been ongoing for approximately three days. Upper abdominal pain + nausea noted today. Currently rates the abdominal pain a 6/10. OTC Tylenol  taken for symptoms at home with little improvement. Daughter is sick with similar sxs.

## 2024-08-09 NOTE — Discharge Instructions (Addendum)
 Your testing was positive for Flu B  Symptoms can last for 3-10 days with lingering cough and intermittent symptoms potentially  lasting several  weeks after that.  The goal of treatment at this time is to reduce your symptoms and discomfort   You can use the following medications and measures to help yourself feel better until your body fights this off: DayQuil/NyQuil, TheraFlu, Alka-Seltzer  (these medications typically have the same active ingredients in them so you can choose whichever one you prefer and take consistently during the day and night according to the manufactures instructions.) Flonase A daily antihistamine such as Zyrtec, Claritin, Allegra per your preference.  Please choose 1 and take consistently. Increased fluids.  It is recommended that you take in at least 64 ounces of water per day when you are not sick so it is important to increase this when you are sick and your body may be running fever. Rest Cough drops Chloraseptic throat spray to help with sore throat Nasal saline spray or nasal flushes to help with congestion and runny nose  I have also sent in a medication called Zofran  to assist with your nausea and vomiting.  You can take this as needed up to every 8 hours.  Please be advised that the most common side effect of this medication is constipation.  If you start to develop the symptoms I recommend taking a few doses of a stool softener and increasing your fiber.  If needed you can also take a few doses of MiraLAX until you have regular bowel movements again.   If your symptoms seem like they are getting worse over the next 5 to 7 days or not improving you can always follow-up here in urgent care or go to your primary care provider for further management. Go to the ER if you begin to have more serious symptoms such as shortness of breath, trouble breathing, loss of consciousness, swelling around the eyes, high fever, severe lasting headaches, vision changes or neck  pain/stiffness.
